# Patient Record
Sex: Male | Born: 1960 | Race: White | Hispanic: No | Smoking: Never smoker
Health system: Southern US, Community
[De-identification: ages and names within clinical notes are randomized; demographics above are authoritative.]

## PROBLEM LIST (undated history)

## (undated) DIAGNOSIS — N419 Inflammatory disease of prostate, unspecified: Secondary | ICD-10-CM

## (undated) DIAGNOSIS — E781 Pure hyperglyceridemia: Secondary | ICD-10-CM

## (undated) DIAGNOSIS — I10 Essential (primary) hypertension: Secondary | ICD-10-CM

## (undated) HISTORY — DX: Inflammatory disease of prostate, unspecified: N41.9

## (undated) HISTORY — DX: Essential (primary) hypertension: I10

## (undated) HISTORY — DX: Pure hyperglyceridemia: E78.1

## (undated) HISTORY — PX: SKIN CANCER EXCISION: SHX779

---

## 2005-10-22 ENCOUNTER — Emergency Department (HOSPITAL_COMMUNITY): Admission: EM | Admit: 2005-10-22 | Discharge: 2005-10-22 | Payer: Self-pay | Admitting: Emergency Medicine

## 2010-04-02 ENCOUNTER — Ambulatory Visit: Payer: Self-pay | Admitting: Cardiology

## 2010-06-25 ENCOUNTER — Ambulatory Visit (INDEPENDENT_AMBULATORY_CARE_PROVIDER_SITE_OTHER): Payer: BC Managed Care – PPO | Admitting: Cardiology

## 2010-06-25 DIAGNOSIS — R002 Palpitations: Secondary | ICD-10-CM

## 2010-06-25 DIAGNOSIS — I10 Essential (primary) hypertension: Secondary | ICD-10-CM

## 2010-06-25 DIAGNOSIS — R0789 Other chest pain: Secondary | ICD-10-CM

## 2010-07-09 ENCOUNTER — Encounter (INDEPENDENT_AMBULATORY_CARE_PROVIDER_SITE_OTHER): Payer: BC Managed Care – PPO | Admitting: Cardiology

## 2010-07-09 ENCOUNTER — Ambulatory Visit (HOSPITAL_COMMUNITY): Payer: BC Managed Care – PPO | Attending: Cardiology

## 2010-07-09 ENCOUNTER — Encounter: Payer: Self-pay | Admitting: Cardiology

## 2010-07-09 DIAGNOSIS — R9439 Abnormal result of other cardiovascular function study: Secondary | ICD-10-CM | POA: Insufficient documentation

## 2010-07-09 DIAGNOSIS — R079 Chest pain, unspecified: Secondary | ICD-10-CM | POA: Insufficient documentation

## 2010-07-09 DIAGNOSIS — R002 Palpitations: Secondary | ICD-10-CM | POA: Insufficient documentation

## 2010-07-09 DIAGNOSIS — R072 Precordial pain: Secondary | ICD-10-CM

## 2010-07-09 HISTORY — PX: US ECHOCARDIOGRAPHY: HXRAD669

## 2010-07-13 NOTE — Assessment & Plan Note (Signed)
Summary: np6/per patsy/mj   Visit Type:  eph Primary Provider:  None  CC:  feels like he has irregular heart beat at times. .  History of Present Illness: Ricky Bennett had a stress Echo today for atypical CP and palpitations. I have reviewed the history with him as well. His GXT showed excellent exercise tolerance but inferolateral ST changes that resolved at 2 mins in recovery. Echo pending. Discussed with pt. and left message for Dr Swaziland to followup.  Current Medications (verified): 1)  Azor 5-20 Mg Tabs (Amlodipine-Olmesartan) .... Take 1 Tablet By Mouth Once A Day  Allergies (verified): No Known Drug Allergies  Vital Signs:  Patient profile:   50 year old male Height:      72 inches Weight:      184 pounds BMI:     25.05 Pulse rate:   78 / minute Resp:     16 per minute BP sitting:   110 / 70  (left arm) Cuff size:   large  Vitals Entered By: Celestia Khat, CMA (July 09, 2010 12:52 PM)   Problems:  Medical Problems Added: 1)  Dx of Abnormal Cv (STRESS) Test  (ICD-794.39)  Impression & Recommendations:  Problem # 1:  ABNORMAL CV (STRESS) TEST (ICD-794.39) Discussed with patient. Dr Swaziland will followup with Echo and the patient early next week.

## 2010-07-14 ENCOUNTER — Telehealth: Payer: Self-pay | Admitting: *Deleted

## 2010-07-14 NOTE — Telephone Encounter (Signed)
Spoke w/ wife re: results of stress echo for [employer]

## 2010-08-13 ENCOUNTER — Telehealth: Payer: Self-pay | Admitting: Cardiology

## 2010-08-13 NOTE — Telephone Encounter (Signed)
Spoke w/ wife stating he wanted to add some vitamins to the other 17 vitamins. Advised that fish oil should be taken but other vitamins may not benefit him.

## 2010-08-13 NOTE — Telephone Encounter (Signed)
Had a question about vitamins that he is taking. Please call back. I have pulled the chart.

## 2010-09-08 ENCOUNTER — Encounter (INDEPENDENT_AMBULATORY_CARE_PROVIDER_SITE_OTHER): Payer: Self-pay | Admitting: General Surgery

## 2010-09-17 LAB — BASIC METABOLIC PANEL
BUN: 16 mg/dL (ref 6–23)
CO2: 24 mEq/L (ref 19–32)
Chloride: 103 mEq/L (ref 96–112)
GFR calc Af Amer: >60 mL/min (ref >60)
Potassium: 4.1 mEq/L (ref 3.5–5.1)
Sodium: 139 mEq/L (ref 135–145)

## 2010-09-17 LAB — DIFFERENTIAL
Basophils Absolute: 0 10*3/uL (ref 0.0–0.1)
Eosinophils Relative: 0 % (ref 0–5)
Lymphs Abs: 1.2 10*3/uL (ref 0.7–4.0)
Monocytes Absolute: 1.1 10*3/uL — ABNORMAL HIGH (ref 0.1–1.0)
Neutro Abs: 12.4 10*3/uL — ABNORMAL HIGH (ref 1.7–7.7)

## 2010-09-17 LAB — CBC
Hemoglobin: 14.8 g/dL (ref 13.0–17.0)
Platelets: 206 10*3/uL (ref 150–400)
RBC: 4.7 MIL/uL (ref 4.22–5.81)

## 2010-09-21 ENCOUNTER — Other Ambulatory Visit: Payer: Self-pay | Admitting: General Surgery

## 2010-09-21 ENCOUNTER — Ambulatory Visit (HOSPITAL_BASED_OUTPATIENT_CLINIC_OR_DEPARTMENT_OTHER)
Admission: RE | Admit: 2010-09-21 | Discharge: 2010-09-21 | Disposition: A | Discharge status: Home / Self Care | Payer: BC Managed Care – PPO | Source: Ambulatory Visit | Attending: General Surgery | Admitting: General Surgery

## 2010-09-21 DIAGNOSIS — K644 Residual hemorrhoidal skin tags: Secondary | ICD-10-CM | POA: Insufficient documentation

## 2010-09-21 DIAGNOSIS — Z01812 Encounter for preprocedural laboratory examination: Secondary | ICD-10-CM | POA: Insufficient documentation

## 2010-09-21 DIAGNOSIS — K648 Other hemorrhoids: Secondary | ICD-10-CM | POA: Insufficient documentation

## 2010-09-21 HISTORY — PX: HEMORRHOID SURGERY: SHX153

## 2010-10-28 NOTE — Op Note (Signed)
Ricky Bennett, Ricky Bennett                 ACCOUNT NO.:  [id number]  MEDICAL RECORD NO.:  [id number]           PATIENT TYPE:  LOCATION:                                 FACILITY:  PHYSICIAN:  Mary Sella. Andrey Campanile, MD     DATE OF BIRTH:  03/16/1961  DATE OF PROCEDURE:  09/21/2010 DATE OF DISCHARGE:                              OPERATIVE REPORT   PREOPERATIVE DIAGNOSIS:  Hemorrhoids.  POSTOPERATIVE DIAGNOSIS:  Grade 2, 3 column internal hemorrhoids.  PROCEDURE: 1. Exam under anesthesia. 2. Hemorrhoidal banding x2 (right posterior and left lateral internal     hemorrhoids). 3. Open excisional hemorrhoidectomy of right anterior internal-     external hemorrhoid.  SURGEON:  Mary Sella. Andrey Campanile, MD  ASSISTANT SURGEON:  None.  ANESTHESIA:  General plus 30 cc of 0.25% Marcaine with epinephrine.  ASSESSMENT:  Internal and external hemorrhoid.  ESTIMATED BLOOD LOSS:  Minimal.  FINDINGS:  The patient had right posterior and a left lateral grade 2 internal hemorrhoids which were banded.  He also had a right anterior internal and external hemorrhoid which was excised with aid of Harmonic scalpel.  The mucosal defect was left open.  INDICATIONS FOR PROCEDURE:  The patient is very pleasant 50 year old gentleman who reports a longstanding history with hemorrhoids over 20 years.  Actually, he has seen Dr. Ezzard Standing back in 1996 and had recommended some banding as well as excisional hemorrhoidectomy, but the patient cancelled the procedure.  He comes in today complaining of occasional flares of hemorrhoidal problems.  About once a month period, he will have 1 or 2 drops of blood in the commode.  His other bowel habits were within normal limits.  He has daily bowel movements.  He does not strain.  He is not a bathroom reader so I offered him an exam under anesthesia with hemorrhoidectomy, possible banding.  The patient elected to proceed to surgery.  We discussed the risks and benefits of surgery including  but not limited to bleeding, infection, pelvic sepsis, urinary retention, DVT occurrence, hemorrhoidal recurrence, injury to the sphincter, and anal canal narrowing.  We also talked about typical postoperative recovery course.  DESCRIPTION OF PROCEDURE:  After obtaining informed consent, the patient was brought to the operating room.  General endotracheal anesthesia was established.  Sequential compression devices had been placed.  He was then placed in the prone jackknife position with the appropriate padding.  His buttocks were taped apart.  His perineum was prepped and draped after Betadine had been prepped.  He received cefoxitin prior to skin incision.  Digital rectal exam was performed which demonstrated no gross palpable hard mass.  Anoscopy was then performed which demonstrated grade 2 right posterior, left lateral internal hemorrhoids as well as right anterior mixed column internal and external hemorrhoid. I first turned my attention to the right anterior internal and external hemorrhoid.  A small V-shaped incision was made in the anoderm right at the anal verge with a #15 blade.  Then using hemostat, I lifted up the hemorrhoidal plexus including the mucosa and submucosa away from the underlying sphincter.  Then using a Harmonic scalpel,  I made an elliptical excision of the hemorrhoid in its entirety.  This came out to the dentate line.  The hemorrhoidal complex was excised.  There was excellent hemostasis.  I then turned my attention to the left lateral position.  I grasped this on the left side was a grade 2 internal hemorrhoid.  I placed 2 rubber bands around the base of the hemorrhoid. In the similar fashion, I put 2 bands around the base of the right posterior internal hemorrhoid.  I then infiltrated 0.25% Marcaine underneath the mucosa around the anal verge and I did a perineal block, a total 30 mL was used.  A piece of Gelfoam was placed in the patient's rectal vault.  We  then placed dibucaine ointment around the perineum.  A 4 x 4s, ABDs, and mesh underwear were then applied.  He was then placed in the supine position, extubated, and taken to recovery room in stable addition.  All needle, instrument, and sponge counts were correct x2. There were no immediate complications.  The patient tolerated the procedure well.     Mary Sella. Andrey Campanile, MD     EMW/MEDQ  D:  09/21/2010  T:  09/22/2010  Job:  [id number]  cc:   Sarah Swaziland, MD  Electronically Signed by Gaynelle Adu M.D. on 10/28/2010 12:02:12 PM

## 2010-11-05 ENCOUNTER — Ambulatory Visit (INDEPENDENT_AMBULATORY_CARE_PROVIDER_SITE_OTHER): Payer: BC Managed Care – PPO | Admitting: General Surgery

## 2010-11-05 ENCOUNTER — Encounter (INDEPENDENT_AMBULATORY_CARE_PROVIDER_SITE_OTHER): Payer: Self-pay | Admitting: General Surgery

## 2010-11-05 DIAGNOSIS — K648 Other hemorrhoids: Secondary | ICD-10-CM

## 2010-11-05 NOTE — Patient Instructions (Signed)

## 2010-11-05 NOTE — Progress Notes (Signed)
Procedure: Exam under anesthesia, hemorrhoidal banding x2 (right posterior and left lateral); excisional hemorrhoidectomy right anterior internal/external hemorrhoid on Sep 21, 2010  History of Present Ilness: 50 year old Caucasian male comes in today for his second postoperative visit. He is much improved since his last visit. He denies any fevers, chills, nausea, or vomiting. He denies any diarrhea or constipation. He reports a daily bowel movement. He denies any pain after defecation. He denies any perianal itching or burning. He denies any melena or hematochezia. His only issue is some jock itch currently. He started using Lamisil yesterday  Physical Exam: There were no vitals taken for this visit.  Well-developed well-nourished Caucasian male in no apparent distress Abdomen-soft nontender, nondistended Rectal-his right anterior incision is completely healed. There is no cellulitis or induration. There is no excoriation of the skin. In the right anterior and left posterior position there is some non-thrombosed external hemorrhoidal tissue. It is no longer enlarged like it was at the last visit. Digital rectal exam reveals good tone. Anoscopy shows no residual internal hemorrhoids  Data reviewed I reviewed the operative note as well as my office visit note from October 01, 2010  Pathology: n/a  Assessment and Plan: Status post exam under anesthesia, hemorrhoidal banding, and excisional hemorrhoidectomy of right anterior internal/external hemorrhoid. I think he's doing very well. He was given educational material about good bowel habits. I encouraged him to continue to Lamisil for his jock itch. We will see him on a p.r.n. basis.

## 2010-11-16 ENCOUNTER — Telehealth: Payer: Self-pay | Admitting: Cardiology

## 2010-11-16 NOTE — Telephone Encounter (Signed)
Wife called stating she is very concern about her husband "fits of rage". He will go off on a rage and yells and that causes BP to go up. She doesn't know what to do. Advised I will speak w/ Dr. Swaziland but there is probably not much we can do. States he doesn't have a PCP. She thinks he needs a "nerve" pill. Will call her back after talking w/Dr. Swaziland.

## 2010-11-16 NOTE — Telephone Encounter (Signed)
Lm

## 2010-11-16 NOTE — Telephone Encounter (Signed)
Called because her husband is having some emotional symptoms (which causes him to go into great rages) that she would like to discuss with you before his office visit this Friday. Please call back. I have pulled the chart.

## 2010-11-17 ENCOUNTER — Telehealth: Payer: Self-pay | Admitting: *Deleted

## 2010-11-17 ENCOUNTER — Encounter: Payer: Self-pay | Admitting: Cardiology

## 2010-11-17 NOTE — Telephone Encounter (Signed)
Follow up from conversation yesterday. Spoke w/Dr. Swaziland and he is not sure how he can approach his behavior without him bringing it up. Suggested they see a counselor. She states they see a marriage counselor but she hasn't addressed those issues. Suggested she speak w/her separately and see if she can suggest to him that he see a specialist. She will try to speak w/ her today.

## 2010-11-19 ENCOUNTER — Ambulatory Visit (INDEPENDENT_AMBULATORY_CARE_PROVIDER_SITE_OTHER): Payer: BC Managed Care – PPO | Admitting: Cardiology

## 2010-11-19 ENCOUNTER — Encounter: Payer: Self-pay | Admitting: Cardiology

## 2010-11-19 DIAGNOSIS — E781 Pure hyperglyceridemia: Secondary | ICD-10-CM | POA: Insufficient documentation

## 2010-11-19 DIAGNOSIS — I10 Essential (primary) hypertension: Secondary | ICD-10-CM

## 2010-11-19 NOTE — Assessment & Plan Note (Signed)
Blood pressure is well controlled on Azor. We will continue with his current therapy and continue to encourage sodium restriction regular aerobic exercise.

## 2010-11-19 NOTE — Patient Instructions (Signed)
Continue current medications.  Stay active.  I will see you again in 6 months with fasting lab work.

## 2010-11-19 NOTE — Progress Notes (Signed)
Ricky Bennett Date of Birth: 06-27-1960   History of Present Illness: Ricky Bennett is seen for followup of his hypertension. He has checked his blood pressure and frequently but reports it has been doing well. He states he feels great. He still exercises regularly either rollerblading or walking. He denies any significant palpitations, dizziness, chest pain, or shortness of breath.  Current Outpatient Prescriptions on File Prior to Visit  Medication Sig Dispense Refill  . Acai 500 MG CAPS Take by mouth.        Marland Kitchen amLODipine-olmesartan (AZOR) 5-20 MG per tablet Take 1 tablet by mouth daily.        . Cholecalciferol 2000 UNITS TABS Take by mouth.        . Cinnamon 500 MG TABS Take by mouth.        Marland Kitchen Cod Liver Oil 1250-130 UNITS CAPS Take by mouth.        . Coenzyme Q10 (CO Q-10) 200 MG CAPS Take by mouth.        . Cranberry 250 MG CAPS Take by mouth.        . Cyanocobalamin (B-12) 500 MCG TABS Take by mouth.        . Flaxseed, Linseed, (FLAX SEED OIL PO) Take 2,400 mg by mouth.        . folic acid (FOLVITE) 800 MCG tablet Take 400 mcg by mouth daily.        . Garlic 2000 MG CAPS Take by mouth.        . Glucosamine-Chondroitin-Vit C 2000-1200-60 MG/30ML LIQD Take by mouth.        . Grape Seed 50 MG TABS Take by mouth.        Ricky Bennett 550 MG CAPS Take by mouth.        . Magnesium 250 MG TABS Take by mouth.        . Methylsulfonylmethane 1000 MG CAPS Take by mouth.        . Niacin-Inositol 400-100 MG CAPS Take by mouth.        . Omega-3 Fatty Acids (FISH OIL) 1200 MG CAPS Take by mouth 2 (two) times daily after a meal.        . Phytosterol Esters (PHYTOSTEROL COMPLEX) 500 MG CAPS Take by mouth.        . pyridOXINE (VITAMIN B-6) 100 MG tablet Take 100 mg by mouth daily.        . Saw Palmetto, Serenoa repens, 450 MG CAPS Take by mouth 2 (two) times daily.        . vitamin C (ASCORBIC ACID) 500 MG tablet Take 500 mg by mouth daily.          No Known Allergies  Past Medical  History  Diagnosis Date  . High blood pressure   . Prostatitis   . Hemorrhoids   . Hypertriglyceridemia     Past Surgical History  Procedure Date  . Hemorrhoid surgery 09/21/10    hemorrhoidal banding x2; Rt anterior hemorrhoidectomy  . US echocardiography 07/09/2010    History  Smoking status  . Never Smoker   Smokeless tobacco  . Not on file    History  Alcohol Use  . Yes    Family History  Problem Relation Age of Onset  . Heart attack Father   . Hypertension Father   . Stroke Father   . Hypertension Brother   . Stroke Brother   . Coronary artery disease Brother     Review of  Systems: As noted in history of present illness. He did undergo a banding hemorrhoidectomy in May. All other systems were reviewed and are negative.  Physical Exam: BP 116/80  Pulse 70  Ht 6' (1.829 m)  Wt 188 lb 3.2 oz (85.367 kg)  BMI 25.52 kg/m2 He is a middle-aged white male in no acute distress. His HEENT exam is unremarkable. He has no JVD or bruits. There is no adenopathy. Lungs are clear. Cardiac exam reveals a regular rate and rhythm without gallop, murmur, or click. Abdomen is soft and nontender without masses or bruits. Femoral and pedal pulses are 2+ and symmetric. He is alert and oriented x3. Cranial nerves II through XII are intact. LABORATORY DATA:   Assessment / Plan:

## 2010-11-19 NOTE — Assessment & Plan Note (Signed)
He is on multiple supplements including multiple omega-3 supplements and niacin. Will repeat fasting blood work on his next visit in 6 months.

## 2010-12-23 ENCOUNTER — Telehealth (INDEPENDENT_AMBULATORY_CARE_PROVIDER_SITE_OTHER): Payer: Self-pay | Admitting: General Surgery

## 2010-12-23 NOTE — Telephone Encounter (Signed)
Patient left message on my voicemail yesterday inquiring about an appt with Dr Andrey Campanile. He needed an appt prior to the end of this month which is in 2 days due to insurance changing. I tried to call him back but the number he left was busy and I left a message on his cell phone.

## 2011-01-24 ENCOUNTER — Other Ambulatory Visit: Payer: Self-pay | Admitting: *Deleted

## 2011-01-24 MED ORDER — AMLODIPINE-OLMESARTAN 5-20 MG PO TABS: 1.0000 | ORAL_TABLET | Freq: Every day | ORAL | Status: DC

## 2011-01-25 ENCOUNTER — Other Ambulatory Visit: Payer: Self-pay | Admitting: Cardiology

## 2011-01-25 NOTE — Telephone Encounter (Signed)
azor 5-20 mg refill needed

## 2011-01-26 MED ORDER — AMLODIPINE-OLMESARTAN 5-20 MG PO TABS: 1.0000 | ORAL_TABLET | Freq: Every day | ORAL | Status: DC

## 2011-01-26 NOTE — Telephone Encounter (Signed)
Pt has completely ran out of RX. Please call in today.

## 2011-01-26 NOTE — Telephone Encounter (Signed)
escribe medication per fax request  

## 2011-01-26 NOTE — Telephone Encounter (Signed)
Pt called back and wants Azor called into [facility] on [address]  It was called in, but to the wrong phar.  Pt is a phar now waiting for this to be called in.  Pt is upset.

## 2011-01-27 ENCOUNTER — Other Ambulatory Visit: Payer: Self-pay | Admitting: *Deleted

## 2011-01-27 MED ORDER — AMLODIPINE-OLMESARTAN 5-20 MG PO TABS: 1.0000 | ORAL_TABLET | Freq: Every day | ORAL | Status: DC

## 2011-01-27 NOTE — Telephone Encounter (Signed)
Refilled azor

## 2011-06-01 ENCOUNTER — Other Ambulatory Visit: Payer: BC Managed Care – PPO | Admitting: *Deleted

## 2011-06-01 ENCOUNTER — Ambulatory Visit (INDEPENDENT_AMBULATORY_CARE_PROVIDER_SITE_OTHER): Payer: BC Managed Care – PPO | Admitting: Cardiology

## 2011-06-01 ENCOUNTER — Encounter: Payer: Self-pay | Admitting: Cardiology

## 2011-06-01 VITALS — BP 120/86 | HR 64 | Ht 72.0 in | Wt 198.0 lb

## 2011-06-01 DIAGNOSIS — I10 Essential (primary) hypertension: Secondary | ICD-10-CM

## 2011-06-01 DIAGNOSIS — E781 Pure hyperglyceridemia: Secondary | ICD-10-CM

## 2011-06-01 DIAGNOSIS — E785 Hyperlipidemia, unspecified: Secondary | ICD-10-CM

## 2011-06-01 LAB — LIPID PANEL
Cholesterol: 197 mg/dL (ref 0–200)
HDL: 41.5 mg/dL (ref >39.00)
Total CHOL/HDL Ratio: 5
Triglycerides: 257 mg/dL — ABNORMAL HIGH (ref 0.0–149.0)
VLDL: 51.4 mg/dL — ABNORMAL HIGH (ref 0.0–40.0)

## 2011-06-01 LAB — BASIC METABOLIC PANEL
BUN: 15 mg/dL (ref 6–23)
Calcium: 9.5 mg/dL (ref 8.4–10.5)
Chloride: 104 mEq/L (ref 96–112)
Creatinine, Ser: 0.8 mg/dL (ref 0.4–1.5)
GFR: 114 mL/min (ref >60.00)
Potassium: 4.2 mEq/L (ref 3.5–5.1)

## 2011-06-01 NOTE — Assessment & Plan Note (Signed)
Blood pressure is well-controlled on exam today. We will continue with his current antihypertensive therapy.

## 2011-06-01 NOTE — Assessment & Plan Note (Signed)
He is on multiple supplements including fish oil and niacin. We will follow up on the fasting lipid panel today. Continue to focus on weight control and regular aerobic exercise.

## 2011-06-01 NOTE — Patient Instructions (Addendum)
We will check blood work today and call with the results.  I will see you again in 6 months.

## 2011-06-01 NOTE — Progress Notes (Signed)
Ricky Bennett Date of Birth: 02/23/1961   History of Present Illness: Ricky Bennett is seen for followup of his hypertension. He has checked his blood pressure and frequently but reports it has been doing well. He states he feels great. He isn't exercising as much because of an injury to his leg. He has gained some weight. He denies any significant palpitations, dizziness, chest pain, or shortness of breath.  Current Outpatient Prescriptions on File Prior to Visit  Medication Sig Dispense Refill  . Acai 500 MG CAPS Take by mouth.        Marland Kitchen amLODipine-olmesartan (AZOR) 5-20 MG per tablet Take 1 tablet by mouth daily.  30 tablet  5  . Cholecalciferol 2000 UNITS TABS Take by mouth.        . Cinnamon 500 MG TABS Take by mouth.        Marland Kitchen Cod Liver Oil 1250-130 UNITS CAPS Take by mouth.        . Coenzyme Q10 (CO Q-10) 200 MG CAPS Take by mouth.        . Cranberry 250 MG CAPS Take by mouth.        . Cyanocobalamin (B-12) 500 MCG TABS Take by mouth.        . Flaxseed, Linseed, (FLAX SEED OIL PO) Take 2,400 mg by mouth.        . folic acid (FOLVITE) 800 MCG tablet Take 400 mcg by mouth daily.        . Garlic 2000 MG CAPS Take by mouth.        . Glucosamine-Chondroitin-Vit C 2000-1200-60 MG/30ML LIQD Take by mouth.        . Grape Seed 50 MG TABS Take by mouth.        Asencion Gowda 550 MG CAPS Take by mouth.        . Magnesium 250 MG TABS Take by mouth.        . Methylsulfonylmethane 1000 MG CAPS Take by mouth.        . Niacin-Inositol 400-100 MG CAPS Take by mouth.        . Omega-3 Fatty Acids (FISH OIL) 1200 MG CAPS Take by mouth 2 (two) times daily after a meal.        . Phytosterol Esters (PHYTOSTEROL COMPLEX) 500 MG CAPS Take by mouth.        . pyridOXINE (VITAMIN B-6) 100 MG tablet Take 100 mg by mouth daily.        . Saw Palmetto, Serenoa repens, 450 MG CAPS Take by mouth 2 (two) times daily.        . vitamin C (ASCORBIC ACID) 500 MG tablet Take 500 mg by mouth daily.          No  Known Allergies  Past Medical History  Diagnosis Date  . HTN (hypertension)   . Prostatitis   . Hemorrhoids   . Hypertriglyceridemia     Past Surgical History  Procedure Date  . Hemorrhoid surgery 09/21/10    hemorrhoidal banding x2; Rt anterior hemorrhoidectomy  . US echocardiography 07/09/2010    History  Smoking status  . Never Smoker   Smokeless tobacco  . Not on file    History  Alcohol Use  . Yes    Family History  Problem Relation Age of Onset  . Heart attack Father   . Hypertension Father   . Stroke Father   . Hypertension Brother   . Stroke Brother   . Coronary artery disease  Brother     Review of Systems: As noted in history of present illness.  All other systems were reviewed and are negative.  Physical Exam: BP 120/86  Pulse 64  Ht 6' (1.829 m)  Wt 198 lb (89.812 kg)  BMI 26.85 kg/m2 He is a middle-aged white male in no acute distress. His HEENT exam is unremarkable. He has no JVD or bruits. There is no adenopathy. Lungs are clear. Cardiac exam reveals a regular rate and rhythm without gallop, murmur, or click. Abdomen is soft and nontender without masses or bruits. Femoral and pedal pulses are 2+ and symmetric. He is alert and oriented x3. Cranial nerves II through XII are intact. LABORATORY DATA:   Assessment / Plan:

## 2011-06-02 ENCOUNTER — Encounter: Payer: Self-pay | Admitting: Internal Medicine

## 2011-06-02 LAB — HEPATIC FUNCTION PANEL
Alkaline Phosphatase: 51 U/L (ref 39–117)
Total Bilirubin: 0.8 mg/dL (ref 0.3–1.2)

## 2011-06-09 ENCOUNTER — Encounter: Payer: Self-pay | Admitting: Gastroenterology

## 2011-06-09 ENCOUNTER — Encounter: Payer: Self-pay | Admitting: Internal Medicine

## 2011-06-13 ENCOUNTER — Encounter: Payer: Self-pay | Admitting: Internal Medicine

## 2011-06-13 ENCOUNTER — Other Ambulatory Visit (INDEPENDENT_AMBULATORY_CARE_PROVIDER_SITE_OTHER): Payer: BC Managed Care – PPO

## 2011-06-13 ENCOUNTER — Ambulatory Visit (INDEPENDENT_AMBULATORY_CARE_PROVIDER_SITE_OTHER): Payer: Self-pay | Admitting: Internal Medicine

## 2011-06-13 VITALS — BP 110/84 | HR 82 | Ht 72.0 in | Wt 192.0 lb

## 2011-06-13 DIAGNOSIS — Z1211 Encounter for screening for malignant neoplasm of colon: Secondary | ICD-10-CM

## 2011-06-13 DIAGNOSIS — R7989 Other specified abnormal findings of blood chemistry: Secondary | ICD-10-CM

## 2011-06-13 LAB — IBC PANEL
Iron: 100 ug/dL (ref 42–165)
Saturation Ratios: 24.5 % (ref 20.0–50.0)

## 2011-06-13 LAB — HEPATIC FUNCTION PANEL
AST: 33 U/L (ref 0–37)
Total Bilirubin: 0.8 mg/dL (ref 0.3–1.2)

## 2011-06-13 LAB — PROTIME-INR: Prothrombin Time: 10.8 s (ref 10.2–12.4)

## 2011-06-13 LAB — FERRITIN: Ferritin: 107.6 ng/mL (ref 22.0–322.0)

## 2011-06-13 NOTE — Patient Instructions (Signed)
Dr. Dorena Bodo recommends you get a colonoscopy, please call to schedule at your earliest convenience.   You have been scheduled for an Ultasound at [facility] on [address]  On 06/14/2011 @ 9:30am; please arrive 15 min prior to your appointment.  Nothing to eat or drink after midnight that day of your appointment.  If you need to reschedule please call [phone]  Your physician has requested that you go to the basement for lab work before leaving today:

## 2011-06-13 NOTE — Progress Notes (Signed)
Subjective:    Patient ID: Ricky Bennett, male    DOB: 02/16/1961, 51 y.o.   MRN: [id number]  HPI Mr. Seefeldt is a 51 year old male with a past medical history of hypertension who seen in consultation at the request of Dr. Swaziland for evaluation of elevated transaminases.  The patient reports no prior history of trouble or abnormal liver enzymes, but he does admit these have probably not been checked often.  He denies a history of jaundice, itching, GI bleeding, ascites, LE edema, confusion. He denies abdominal pain, nausea, vomiting. Weight is stable with normal appetite. Normal bowel habits. He reports no significant change in medications, but does take many over-the-counter vitamin and dietary supplements. These medications are all balled locally, and not over the Internet or internationally. He denies a family history of liver disease. He denies alcohol use regularly, and reports very rarely having half a glass of wine.  Review of Systems Constitutional: Negative for fever, chills, night sweats, activity change, appetite change and unexpected weight change HEENT: Negative for sore throat, mouth sores and trouble swallowing. Eyes: Negative for visual disturbance Respiratory: Negative for cough, chest tightness and shortness of breath Cardiovascular: Negative for chest pain, palpitations and lower extremity swelling Gastrointestinal: See history of present illness Genitourinary: Negative for dysuria and hematuria. Musculoskeletal: Negative for back pain, arthralgias and myalgias Skin: Negative for rash or color change Neurological: Negative for headaches, weakness, numbness Hematological: Negative for adenopathy, negative for easy bruising/bleeding Psychiatric/behavioral: Negative for depressed mood, negative for anxiety  Patient Active Problem List  Diagnoses  . HTN (hypertension)  . Hypertriglyceridemia   Past Surgical History  Procedure Date  . Hemorrhoid surgery 09/21/10   hemorrhoidal banding x2; Rt anterior hemorrhoidectomy  . US echocardiography 07/09/2010   Current Outpatient Prescriptions  Medication Sig Dispense Refill  . Acai 500 MG CAPS Take 1,000 mg by mouth.       Marland Kitchen amLODipine-olmesartan (AZOR) 5-20 MG per tablet Take 1 tablet by mouth daily.  30 tablet  5  . Arginine 500 MG CAPS Take by mouth. 400 mg      . Ascorbic Acid (VITAMIN C) 100 MG tablet Take 100 mg by mouth daily.      Marland Kitchen BIOTIN PO Take by mouth.      . cholecalciferol (VITAMIN D) 1000 UNITS tablet Take 1,000 Units by mouth daily.      . Cinnamon 500 MG TABS Take by mouth.        Marland Kitchen Cod Liver Oil 1250-130 UNITS CAPS Take by mouth.        . Coenzyme Q10 (CO Q-10) 200 MG CAPS Take by mouth.        . Cranberry 250 MG CAPS Take by mouth.        . Cyanocobalamin (B-12) 500 MCG TABS Take 1,000 mcg by mouth.       . Flaxseed, Linseed, (FLAX SEED OIL PO) Take 2,400 mg by mouth.        . folic acid (FOLVITE) 800 MCG tablet Take 400 mcg by mouth daily.       . Garlic 2000 MG CAPS Take 75 mg by mouth.       . Glucosamine-Chondroitin-Vit C 2000-1200-60 MG/30ML LIQD Take by mouth.        . Grape Seed 50 MG TABS Take by mouth.        Asencion Gowda 550 MG CAPS Take by mouth.        . Magnesium 250 MG TABS Take 500 mg by  mouth.       . Niacin-Inositol 400-100 MG CAPS Take by mouth.        . Omega-3 Fatty Acids (FISH OIL) 1000 MG CPDR Take by mouth.      . Phytosterol Esters (PHYTOSTEROL COMPLEX) 500 MG CAPS Take by mouth.        . pyridOXINE (VITAMIN B-6) 100 MG tablet Take 100 mg by mouth daily.        . Saw Palmetto, Serenoa repens, 450 MG CAPS Take 900 mg by mouth 2 (two) times daily.       . vitamin C (ASCORBIC ACID) 500 MG tablet Take 500 mg by mouth daily.        Marland Kitchen VITAMIN E BLEND PO Take by mouth. 3 IU with Cranberry supplement       No Known Allergies  Family History  Problem Relation Age of Onset  . Heart attack Father   . Hypertension Father   . Stroke Father   . Hypertension  Brother   . Stroke Brother   . Coronary artery disease Brother   Neg for CRC   Social History  . Marital Status: Married    Number of Children: 6   Occupational History  . burglar alarms/fire alarms    Social History Main Topics  . Smoking status: Never Smoker   . Smokeless tobacco: None  . Alcohol Use: No  . Drug Use: No      Objective:   Physical Exam BP 110/84  Pulse 82  Ht 6' (1.829 m)  Wt 192 lb (87.091 kg)  BMI 26.04 kg/m2 Constitutional: Well-developed and well-nourished. No distress. HEENT: Normocephalic and atraumatic. Oropharynx is clear and moist. No oropharyngeal exudate. Conjunctivae are normal. Pupils are equal round and reactive to light. No scleral icterus. Neck: Neck supple. Trachea midline. Cardiovascular: Normal rate, regular rhythm and intact distal pulses. No M/R/G Pulmonary/chest: Effort normal and breath sounds normal. No wheezing, rales or rhonchi. Abdominal: Soft, nontender, nondistended. Bowel sounds active throughout. There are no masses palpable. No hepatosplenomegaly. Extremities: no clubbing, cyanosis, or edema Lymphadenopathy: No cervical adenopathy noted. Neurological: Alert and oriented to person place and time. Skin: Skin is warm and dry. No rashes noted. Skin is considerably tanned even on abdomen Psychiatric: Normal mood and affect. Behavior is normal.  CMP     Component Value Date/Time   NA 139 06/01/2011 1245   K 4.2 06/01/2011 1245   CL 104 06/01/2011 1245   CO2 28 06/01/2011 1245   GLUCOSE 104* 06/01/2011 1245   BUN 15 06/01/2011 1245   CREATININE 0.8 06/01/2011 1245   CALCIUM 9.5 06/01/2011 1245   PROT 7.4 06/01/2011 1245   ALBUMIN 4.5 06/01/2011 1245   AST 57* 06/01/2011 1245   ALT 100* 06/01/2011 1245   ALKPHOS 51 06/01/2011 1245   BILITOT 0.8 06/01/2011 1245   GFRNONAA >60 09/17/2010 1411   GFRAA  Value: >60        The eGFR has been calculated using the MDRD equation. This calculation has not been validated in all clinical situations. eGFR's  persistently <60 mL/min signify possible Chronic Kidney Disease. 09/17/2010 1411    CBC    Component Value Date/Time   WBC 14.7* 09/17/2010 1411   RBC 4.70 09/17/2010 1411   HGB 14.8 09/17/2010 1411   HCT 43.6 09/17/2010 1411   PLT 206 09/17/2010 1411   MCV 92.8 09/17/2010 1411   MCH 31.5 09/17/2010 1411   MCHC 33.9 09/17/2010 1411   RDW 12.6 09/17/2010 1411  LYMPHSABS 1.2 09/17/2010 1411   MONOABS 1.1* 09/17/2010 1411   EOSABS 0.0 09/17/2010 1411   BASOSABS 0.0 09/17/2010 1411   ABDOMEN CT WITHOUT CONTRAST - 10/22/05:    Technique: Multidetector CT imaging of the abdomen was performed following the standard protocol without IV contrast.    Comparison: None.    Findings: The liver and spleen have normal uninfused features. The stomach, duodenum, pancreas, gallbladder, and adrenal glands are unremarkable.   A punctate nonobstructing stone is noted in the upper pole of the right kidney. The left kidney is edematous with perinephric stranding and stranding around the proximal ureter. No intraperitoneal free fluid. No evidence for abdominal lymphadenopathy.    IMPRESSION:    1. Secondary changes in the left kidney. There is mild fullness of the collecting system but no overt hydronephrosis.   2. Punctate nonobstructing upper pole calculus on the right.   PELVIS CT WITHOUT CONTRAST - 10/22/05:    Technique: Multidetector CT imaging of the pelvis was performed following the standard protocol without IV contrast.    Findings: A 2 mm stone is identified in the distal left ureter, about 1 cm proximal to the ureterovesical junction. There is no evidence for bladder stones. No right ureteral calculi are apparent. There is no intraperitoneal free fluid.   Mild diverticular change is noted in the left colon without diverticulitis. The appendix and terminal ileum are normal.   IMPRESSION:    1. 2 mm calculus in the distal left ureter with mild secondary changes in the left kidney.   2. Diverticulosis.         Assessment & Plan:  51 year old male with a past medical history of hypertension who seen in consultation at the request of Dr. Sw[place]iland for evaluation of elevated transaminases  1. Elevated liver enzymes -- the patient did have mildly elevated AST/ALT on recent hepatic function panel. On further review there is no prior values available for trending.  At this point, the first thing is to repeat the liver function tests to see if these abnormalities remain. The pattern of injury is a hepatocellular, and I will order additional lab work to workup and exclude other possible causes for liver inflammation such as genetic diseases.  It does not appear that alcohol is a contributing issue in this patient. He does take multiple vitamins and herbal supplements, which possibly could explain his mild hepatitis.  For now I will check viral hepatitis studies, iron studies, ANA and IgG, ASMA, AMA, ceruloplasmin, alpha-1.  Also order a liver ultrasound with Doppler to evaluate liver blood flow.  I'll see him back after the studies are completed to further discuss possible workup. If the inflammation process over time and no exact etiology is determined, then we discussed possible liver biopsy.  2. CRC screening -- the patient is average risk for colorectal cancer, and I have recommended colonoscopy for him at this time based on his age.  He would like to think more about this, and is aware of my recommendation. We can discuss this again at followup

## 2011-06-14 ENCOUNTER — Ambulatory Visit
Admission: RE | Admit: 2011-06-14 | Discharge: 2011-06-14 | Disposition: A | Discharge status: Home / Self Care | Payer: BC Managed Care – PPO | Source: Ambulatory Visit | Attending: Internal Medicine | Admitting: Internal Medicine

## 2011-06-14 LAB — ANA: Anti Nuclear Antibody(ANA): NEGATIVE

## 2011-06-14 LAB — HEPATITIS A ANTIBODY, TOTAL: Hep A Total Ab: NEGATIVE

## 2011-06-15 LAB — ANTI-SMOOTH MUSCLE ANTIBODY, IGG: Smooth Muscle Ab: 4 U (ref <20)

## 2011-06-15 LAB — MITOCHONDRIAL ANTIBODIES: Mitochondrial M2 Ab, IgG: 0.13 (ref <0.91)

## 2011-06-20 ENCOUNTER — Encounter: Payer: Self-pay | Admitting: *Deleted

## 2011-06-20 ENCOUNTER — Other Ambulatory Visit: Payer: Self-pay | Admitting: Internal Medicine

## 2011-06-20 NOTE — Telephone Encounter (Signed)
Opened in error

## 2011-06-28 ENCOUNTER — Telehealth: Payer: Self-pay | Admitting: Internal Medicine

## 2011-06-28 ENCOUNTER — Encounter: Payer: Self-pay | Admitting: Internal Medicine

## 2011-06-28 ENCOUNTER — Ambulatory Visit (INDEPENDENT_AMBULATORY_CARE_PROVIDER_SITE_OTHER): Payer: BC Managed Care – PPO | Admitting: Internal Medicine

## 2011-06-28 VITALS — BP 118/80 | HR 78 | Ht 72.0 in | Wt 192.0 lb

## 2011-06-28 DIAGNOSIS — K5730 Diverticulosis of large intestine without perforation or abscess without bleeding: Secondary | ICD-10-CM | POA: Insufficient documentation

## 2011-06-28 DIAGNOSIS — K5792 Diverticulitis of intestine, part unspecified, without perforation or abscess without bleeding: Secondary | ICD-10-CM

## 2011-06-28 DIAGNOSIS — K5732 Diverticulitis of large intestine without perforation or abscess without bleeding: Secondary | ICD-10-CM

## 2011-06-28 MED ORDER — METRONIDAZOLE 500 MG PO TABS: 500.0000 mg | ORAL_TABLET | Freq: Three times a day (TID) | ORAL | Status: AC

## 2011-06-28 MED ORDER — CIPROFLOXACIN HCL 500 MG PO TABS: 500.0000 mg | ORAL_TABLET | Freq: Two times a day (BID) | ORAL | Status: AC

## 2011-06-28 NOTE — Progress Notes (Signed)
  Subjective:    Patient ID: Ricky Bennett, male    DOB: [birth year], 51 y.o.   MRN: [id number]  HPI Mr. Bilal is a 51 year old male whom I saw about 2 weeks ago for evaluation of elevation in liver transaminases, who returns today in an acute visit for evaluation of left lower quadrant pain. The patient states that over the past 4 days he's had increasing left lower quadrant pain. He describes this as a near constant pain with periods of more sharp intense pain. It did cause him difficulty with sleeping last night. He reports it is tender to the touch and worse when he needs to pass gas. He has not had fevers or chills. He reports normal bowel movements without diarrhea or constipation. He denies bright red blood per rectum or melena. His appetite has been okay and he has not had nausea or vomiting. He reports having twinges of similar pain in the past but this is usually self-limiting and did not last longer than one day.  Review of Systems As per history of present illness otherwise negative   Current Medications, Allergies, Past Medical History, Past Surgical History, Family History and Social History were reviewed in [employer] record.      Objective:   Physical Exam BP 118/80  Pulse 78  Ht 6' (1.829 m)  Wt 192 lb (87.091 kg)  BMI 26.04 kg/m2  SpO2 99% Constitutional: Well-developed and well-nourished. No distress. HEENT: Normocephalic and atraumatic. Oropharynx is clear and moist. No oropharyngeal exudate. Conjunctivae are normal. Pupils are equal round and reactive to light. No scleral icterus. Cardiovascular: Normal rate, regular rhythm and intact distal pulses. No M/R/G Pulmonary/chest: Effort normal and breath sounds normal. No wheezing, rales or rhonchi. Abdominal: Soft, moderate tenderness to palpation the left lower quadrant without rebound or guarding, nondistended. Bowel sounds active throughout. There are no masses palpable. No  hepatosplenomegaly. Extremities: no clubbing, cyanosis, or edema Neurological: Alert and oriented to person place and time. Skin: Skin is warm and dry. No rashes noted. Psychiatric: Normal mood and affect. Behavior is normal.     Assessment & Plan:  51 year old male whom I saw about 2 weeks ago for evaluation of elevation in liver transaminases, who returns today in an acute visit for evaluation of left lower quadrant pain.  1. LLQ pain -- the patient's never had a colonoscopy, but on review of prior imaging he did have a CT scan of his abdomen and pelvis which showed left-sided diverticulosis (this was done in 2007). The most likely etiology of the patient's current symptoms is mild diverticulitis. At this point I have recommended empiric therapy for diverticulitis with ciprofloxacin and metronidazole. He'll take these for 10 days. I have again recommended colonoscopy, but this should be done after his diverticulitis has resolved completely. We will place a reminder for a colonoscopy in 2-3 months. I've asked that he contact us if his symptoms worsen or if he develops fever, chills, nausea or vomiting. He voices understanding. I also recommended that he complete the antibiotic course even if he feels better quickly. Again, he voices understanding.

## 2011-06-28 NOTE — Telephone Encounter (Signed)
Pt reports l side abd pain or soreness to touch x 4 days. Pt denies a fever, diarrhea or blood in his stool. He has not had a COLON here before. Per Dr Rhea Belton, add pt on today at 10am.

## 2011-06-28 NOTE — Patient Instructions (Signed)
We have sent the following medications to your pharmacy for you to pick up at your convenience: Cipro and Flagyl, please take as directed.  Dr. Rhea Belton would like to see you back in 12 weeks for a colonoscopy.

## 2011-06-29 ENCOUNTER — Telehealth: Payer: Self-pay | Admitting: Internal Medicine

## 2011-06-29 NOTE — Telephone Encounter (Signed)
Pt has been reading the side effects and warnings on Cipro and Flagyl handouts. He had dark urine and was encouraged to drink more fluids. He will stop his magnesium supplement until he completes therapy for diverticulitis. Pt stated understanding and will call back for further problems or questions.

## 2011-07-28 ENCOUNTER — Other Ambulatory Visit: Payer: Self-pay

## 2011-07-28 ENCOUNTER — Other Ambulatory Visit: Payer: Self-pay | Admitting: Cardiology

## 2011-07-28 MED ORDER — AMLODIPINE-OLMESARTAN 5-20 MG PO TABS: 1.0000 | ORAL_TABLET | Freq: Every day | ORAL | Status: DC

## 2011-09-20 ENCOUNTER — Telehealth: Payer: Self-pay | Admitting: *Deleted

## 2011-09-20 NOTE — Telephone Encounter (Signed)
Message copied by Florene Glen on Tue Sep 20, 2011 10:50 AM ------      Message from: Florene Glen      Created: Mon Jun 20, 2011  4:35 PM       Pt needs labs on 1 day- INR, Hepatic Panel, then appt with Dr Rhea Belton the next

## 2011-09-20 NOTE — Telephone Encounter (Signed)
lmom for pt to call back

## 2011-09-23 NOTE — Telephone Encounter (Signed)
Informed pt that he needs a f/u with Dr Rhea Belton and labs at least a day before the visit. Pt stated understanding and will come on 10/03/11.

## 2011-09-27 ENCOUNTER — Encounter: Payer: Self-pay | Admitting: Internal Medicine

## 2011-10-03 ENCOUNTER — Ambulatory Visit: Payer: BC Managed Care – PPO | Admitting: Internal Medicine

## 2011-10-17 ENCOUNTER — Ambulatory Visit: Payer: BC Managed Care – PPO | Admitting: Internal Medicine

## 2011-10-20 ENCOUNTER — Other Ambulatory Visit (INDEPENDENT_AMBULATORY_CARE_PROVIDER_SITE_OTHER): Payer: BC Managed Care – PPO

## 2011-10-20 ENCOUNTER — Encounter: Payer: Self-pay | Admitting: Internal Medicine

## 2011-10-20 DIAGNOSIS — R7989 Other specified abnormal findings of blood chemistry: Secondary | ICD-10-CM

## 2011-10-20 LAB — HEPATIC FUNCTION PANEL
ALT: 37 U/L (ref 0–53)
AST: 29 U/L (ref 0–37)
Total Bilirubin: 0.9 mg/dL (ref 0.3–1.2)

## 2011-10-21 ENCOUNTER — Encounter: Payer: Self-pay | Admitting: Internal Medicine

## 2011-10-21 ENCOUNTER — Ambulatory Visit (INDEPENDENT_AMBULATORY_CARE_PROVIDER_SITE_OTHER): Payer: BC Managed Care – PPO | Admitting: Internal Medicine

## 2011-10-21 VITALS — BP 110/74 | HR 66 | Ht 72.0 in | Wt 189.6 lb

## 2011-10-21 DIAGNOSIS — Z1211 Encounter for screening for malignant neoplasm of colon: Secondary | ICD-10-CM

## 2011-10-21 DIAGNOSIS — R7989 Other specified abnormal findings of blood chemistry: Secondary | ICD-10-CM

## 2011-10-21 DIAGNOSIS — Z8719 Personal history of other diseases of the digestive system: Secondary | ICD-10-CM

## 2011-10-21 MED ORDER — PEG-KCL-NACL-NASULF-NA ASC-C 100 G PO SOLR
1.0000 | Freq: Once | ORAL | Status: DC
Start: 2011-10-21 — End: 2012-12-19

## 2011-10-21 NOTE — Progress Notes (Signed)
Subjective:    Patient ID: Ricky Bennett, male    DOB: [birth year], 51 y.o.   MRN: [id number]  HPI 51 year old male with a past medical history of hypertension who seen in follow-up for elevated serum transaminases and treated diverticulitis. He reports that he is doing quite well. His left lower quadrant abdominal pain improved promptly with antibiotics and has not returned. His bowel habits have been normal for him without blood or melena. No further abdominal pain. Regarding his liver, he denies complaints. No jaundice. No itching. No GI bleeding. No ascites or other extremity edema. He continues to use multiple over-the-counter supplements all of which are listed in his med list. He is not drinking significant amounts of alcohol present. No fevers or chills. He feels well.  Review of Systems As per history of present illness, otherwise negative  Current Medications, Allergies, Past Medical History, Past Surgical History, Family History and Social History were reviewed in [employer] record.     Objective:   Physical Exam BP 110/74  Pulse 66  Ht 6' (1.829 m)  Wt 189 lb 9.6 oz (86.002 kg)  BMI 25.71 kg/m2 Constitutional: Well-developed and well-nourished. No distress. HEENT: Normocephalic and atraumatic. Oropharynx is clear and moist. No oropharyngeal exudate. Conjunctivae are normal. Pupils are equal round and reactive to light. No scleral icterus. Neck: Neck supple. Trachea midline. Cardiovascular: Normal rate, regular rhythm and intact distal pulses. No M/R/G Pulmonary/chest: Effort normal and breath sounds normal. No wheezing, rales or rhonchi. Abdominal: Soft, nontender, nondistended. Bowel sounds active throughout. There are no masses palpable. No hepatosplenomegaly. Extremities: no clubbing, cyanosis, or edema Lymphadenopathy: No cervical adenopathy noted. Neurological: Alert and oriented to person place and time. Skin: Skin is warm and dry. No rashes  noted. Psychiatric: Normal mood and affect. Behavior is normal.  CMP     Component Value Date/Time   NA 139 06/01/2011 1245   K 4.2 06/01/2011 1245   CL 104 06/01/2011 1245   CO2 28 06/01/2011 1245   GLUCOSE 104* 06/01/2011 1245   BUN 15 06/01/2011 1245   CREATININE 0.8 06/01/2011 1245   CALCIUM 9.5 06/01/2011 1245   PROT 7.0 10/20/2011 1408   ALBUMIN 4.1 10/20/2011 1408   AST 29 10/20/2011 1408   ALT 37 10/20/2011 1408   ALKPHOS 46 10/20/2011 1408   BILITOT 0.9 10/20/2011 1408   GFRNONAA >60 09/17/2010 1411   GFRAA  Value: >60        The eGFR has been calculated using the MDRD equation. This calculation has not been validated in all clinical situations. eGFR's persistently <60 mL/min signify possible Chronic Kidney Disease. 09/17/2010 1411   Right upper quadrant ultrasound Clinical Data:  History of kidney stones.  Elevated liver function test.   LIMITED ABDOMINAL ULTRASOUND - RIGHT UPPER QUADRANT   Comparison:  CT 10/22/2005   Findings:   Gallbladder:  Normal gallbladder.  No gallstones or gallbladder wall thickening.   Common bile duct:  3.6 cm.   Liver:  Echogenic liver diffusely compatible with fatty infiltration.  No focal liver lesion.   IMPRESSION: Negative for gallstones.   Fatty liver.      Assessment & Plan:  51 year old male with a past medical history of hypertension who seen in follow-up for elevated serum transaminases and treated diverticulitis  1. Elevated LFTs/fatty liver -- the patient's hepatic panel was checked yesterday and his serum transaminases have normalized. In fact his entire liver profile is normal now. He had an intensive laboratory evaluation for other  underlying causes of liver disease and this was negative. His ultrasound showed fatty liver and therefore it is likely that fatty liver contributor to his mild transaminitis. I reminded him that diet and exercise, and modest weight loss can improve his liver numbers. We will continue to watch is going forward,  but now there does not appear to be significant liver inflammation/hepatitis. We will recheck his liver profile in 6 months. I've asked that he continue to avoid alcohol whenever possible.  2. History of diverticulitis/cortical cancer screening -- I diagnosed him clinically with diverticulitis in February and treated him successfully with antibiotics. Review CT scan did show the presence of diverticulosis but he has never had a colonoscopy. I discussed with him scheduling colonoscopy now both for colorectal cancer screening and for direct visualization given his episode of diverticulitis. He is agreeable to proceed.

## 2011-10-21 NOTE — Patient Instructions (Addendum)
You have been scheduled for a colonoscopy with propofol. Please follow written instructions given to you at your visit today.  Please pick up your prep kit at the pharmacy within the next 1-3 days.  Your physician has requested that you go to the basement for the following lab work before leaving today: Hepatic Function panel, in December  We have sent the following medications to your pharmacy for you to pick up at your convenience: moviprep, you were given instructions today at your visit

## 2011-11-19 ENCOUNTER — Other Ambulatory Visit: Payer: Self-pay | Admitting: Cardiology

## 2011-12-15 ENCOUNTER — Telehealth: Payer: Self-pay | Admitting: Internal Medicine

## 2011-12-15 ENCOUNTER — Encounter: Payer: BC Managed Care – PPO | Admitting: Internal Medicine

## 2011-12-20 ENCOUNTER — Ambulatory Visit: Payer: BC Managed Care – PPO | Admitting: Cardiology

## 2012-01-13 ENCOUNTER — Ambulatory Visit: Payer: BC Managed Care – PPO | Admitting: Cardiology

## 2012-01-19 ENCOUNTER — Encounter: Payer: Self-pay | Admitting: Internal Medicine

## 2012-01-30 ENCOUNTER — Ambulatory Visit: Payer: BC Managed Care – PPO | Admitting: Cardiology

## 2012-02-09 ENCOUNTER — Ambulatory Visit: Payer: BC Managed Care – PPO | Admitting: Cardiology

## 2012-02-17 ENCOUNTER — Ambulatory Visit: Payer: BC Managed Care – PPO | Admitting: Cardiology

## 2012-03-15 ENCOUNTER — Encounter: Payer: Self-pay | Admitting: Cardiology

## 2012-03-15 ENCOUNTER — Ambulatory Visit (INDEPENDENT_AMBULATORY_CARE_PROVIDER_SITE_OTHER): Payer: BC Managed Care – PPO | Admitting: Cardiology

## 2012-03-15 VITALS — BP 120/84 | HR 70 | Ht 72.0 in | Wt 190.1 lb

## 2012-03-15 DIAGNOSIS — E781 Pure hyperglyceridemia: Secondary | ICD-10-CM

## 2012-03-15 DIAGNOSIS — I10 Essential (primary) hypertension: Secondary | ICD-10-CM

## 2012-03-15 NOTE — Progress Notes (Signed)
Ricky Bennett Date of Birth: May 07, 1960   History of Present Illness: Ricky Bennett is seen for followup of his hypertension. He has checked his blood pressure and frequently but reports it has been doing well. He continues to exercise regularly with rollerskating. Previously he was noted to have elevated LFTs. He underwent extensive evaluation with GI. Blood work was unremarkable. Abdominal ultrasound showed normal gallbladder with some fatty infiltration of the liver. Subsequent liver function studies returned to normal.   Current Outpatient Prescriptions on File Prior to Visit  Medication Sig Dispense Refill  . Acai 500 MG CAPS Take 1,000 mg by mouth.       . Arginine 500 MG CAPS Take by mouth. 400 mg      . Ascorbic Acid (VITAMIN C) 100 MG tablet Take 100 mg by mouth daily.      . AZOR 5-20 MG per tablet TAKE 1 TABLET BY MOUTH DAILY.  30 tablet  3  . BIOTIN PO Take by mouth.      . cholecalciferol (VITAMIN D) 1000 UNITS tablet Take 1,000 Units by mouth daily.      . Cinnamon 500 MG TABS Take by mouth.        Marland Kitchen Cod Liver Oil 1250-130 UNITS CAPS Take by mouth.        . Coenzyme Q10 (CO Q-10) 200 MG CAPS Take by mouth.        . Cranberry 250 MG CAPS Take by mouth.        . Cyanocobalamin (B-12) 500 MCG TABS Take 1,000 mcg by mouth.       . Flaxseed, Linseed, (FLAX SEED OIL PO) Take 2,400 mg by mouth.        . folic acid (FOLVITE) 800 MCG tablet Take 400 mcg by mouth daily.       . Garlic 2000 MG CAPS Take 75 mg by mouth.       . Glucosamine-Chondroitin-Vit C 2000-1200-60 MG/30ML LIQD Take by mouth.        . Grape Seed 50 MG TABS Take by mouth.        Asencion Gowda 550 MG CAPS Take by mouth.        . Magnesium 250 MG TABS Take 500 mg by mouth.       . Niacin-Inositol 400-100 MG CAPS Take by mouth.        . Omega-3 Fatty Acids (FISH OIL) 1000 MG CPDR Take by mouth.      . peg 3350 powder (MOVIPREP) 100 G SOLR Take 1 kit (100 g total) by mouth once.  1 kit  0  . Phytosterol Esters  (PHYTOSTEROL COMPLEX) 500 MG CAPS Take by mouth.        . pyridOXINE (VITAMIN B-6) 100 MG tablet Take 100 mg by mouth daily.        . Saw Palmetto, Serenoa repens, 450 MG CAPS Take 900 mg by mouth 2 (two) times daily.       . vitamin C (ASCORBIC ACID) 500 MG tablet Take 500 mg by mouth daily.        Marland Kitchen VITAMIN E BLEND PO Take by mouth. 3 IU with Cranberry supplement        No Known Allergies  Past Medical History  Diagnosis Date  . HTN (hypertension)   . Prostatitis   . Hemorrhoids   . Hypertriglyceridemia     Past Surgical History  Procedure Date  . Hemorrhoid surgery 09/21/10    hemorrhoidal banding x2; Rt  anterior hemorrhoidectomy  . US echocardiography 07/09/2010    History  Smoking status  . Never Smoker   Smokeless tobacco  . Never Used    History  Alcohol Use No    Family History  Problem Relation Age of Onset  . Heart attack Father   . Hypertension Father   . Stroke Father   . Hypertension Brother   . Stroke Brother   . Coronary artery disease Brother     Review of Systems: As noted in history of present illness.  He continues to take multiple supplements. All other systems were reviewed and are negative.  Physical Exam: BP 120/84  Pulse 70  Ht 6' (1.829 m)  Wt 190 lb 2.1 oz (86.242 kg)  BMI 25.79 kg/m2  SpO2 95% He is a middle-aged white male in no acute distress. His HEENT exam is unremarkable. He has no JVD or bruits. There is no adenopathy. Lungs are clear. Cardiac exam reveals a regular rate and rhythm without gallop, murmur, or click. Abdomen is soft and nontender without masses or bruits. Femoral and pedal pulses are 2+ and symmetric. He is alert and oriented x3. Cranial nerves II through XII are intact. LABORATORY DATA: ECG demonstrates normal sinus rhythm with nonspecific T-wave abnormality.  Assessment / Plan: 1. Hypertension. Blood pressures well controlled on Azor. We will continue his current therapy.  2. Hypertriglyceridemia. I  recommended continued focus on a Mediterranean diet and restriction of simple sweets. Can't maintain good weight control and regular exercise. We will followup fasting lab work on his next visit in 6 months.

## 2012-03-15 NOTE — Patient Instructions (Signed)
Continue your current BP therapy  Work on eating a healthy diet (Mediterranean), regular exercise, and control your weight.  I will see you in 6 months.

## 2012-04-24 ENCOUNTER — Other Ambulatory Visit: Payer: Self-pay

## 2012-04-24 MED ORDER — AMLODIPINE-OLMESARTAN 5-20 MG PO TABS: 1.0000 | ORAL_TABLET | Freq: Every day | ORAL | Status: DC

## 2012-07-27 ENCOUNTER — Encounter: Payer: Self-pay | Admitting: Internal Medicine

## 2012-08-22 ENCOUNTER — Other Ambulatory Visit: Payer: Self-pay | Admitting: Cardiology

## 2012-09-07 ENCOUNTER — Other Ambulatory Visit (INDEPENDENT_AMBULATORY_CARE_PROVIDER_SITE_OTHER): Payer: BC Managed Care – PPO

## 2012-09-07 DIAGNOSIS — E781 Pure hyperglyceridemia: Secondary | ICD-10-CM

## 2012-09-07 DIAGNOSIS — I10 Essential (primary) hypertension: Secondary | ICD-10-CM

## 2012-09-07 LAB — LIPID PANEL
Cholesterol: 169 mg/dL (ref 0–200)
HDL: 38.7 mg/dL — ABNORMAL LOW (ref >39.00)
LDL Cholesterol: 94 mg/dL (ref 0–99)
Triglycerides: 180 mg/dL — ABNORMAL HIGH (ref 0.0–149.0)
VLDL: 36 mg/dL (ref 0.0–40.0)

## 2012-09-07 LAB — BASIC METABOLIC PANEL
BUN: 13 mg/dL (ref 6–23)
Chloride: 104 mEq/L (ref 96–112)
Potassium: 4 mEq/L (ref 3.5–5.1)
Sodium: 138 mEq/L (ref 135–145)

## 2012-09-07 LAB — HEPATIC FUNCTION PANEL
ALT: 51 U/L (ref 0–53)
Albumin: 4.3 g/dL (ref 3.5–5.2)
Bilirubin, Direct: 0 mg/dL (ref 0.0–0.3)
Total Protein: 7.1 g/dL (ref 6.0–8.3)

## 2012-09-20 ENCOUNTER — Other Ambulatory Visit: Payer: Self-pay | Admitting: Cardiology

## 2012-11-20 ENCOUNTER — Other Ambulatory Visit: Payer: Self-pay | Admitting: Cardiology

## 2012-11-22 ENCOUNTER — Ambulatory Visit: Payer: BC Managed Care – PPO | Admitting: Cardiology

## 2012-12-10 ENCOUNTER — Encounter: Payer: Self-pay | Admitting: Cardiology

## 2012-12-19 ENCOUNTER — Encounter: Payer: Self-pay | Admitting: Cardiology

## 2012-12-19 ENCOUNTER — Ambulatory Visit (INDEPENDENT_AMBULATORY_CARE_PROVIDER_SITE_OTHER): Payer: BC Managed Care – PPO | Admitting: Cardiology

## 2012-12-19 VITALS — BP 127/78 | HR 86 | Ht 72.0 in | Wt 182.8 lb

## 2012-12-19 DIAGNOSIS — I10 Essential (primary) hypertension: Secondary | ICD-10-CM

## 2012-12-19 DIAGNOSIS — E781 Pure hyperglyceridemia: Secondary | ICD-10-CM

## 2012-12-19 NOTE — Patient Instructions (Signed)
Continue your current therapy  I will see you in 6 months with fasting lab work.   

## 2012-12-19 NOTE — Progress Notes (Signed)
Ricky Bennett Date of Birth: 1960-08-11   History of Present Illness: Ricky Bennett is seen for followup of his hypertension.  He continues to exercise regularly with rollerskating. He is under increased stress recently due to family factors. He continue to take a lot of health food supplements. No chest pain or SOB. Rare lightheadedness.  Current Outpatient Prescriptions on File Prior to Visit  Medication Sig Dispense Refill  . Acai 500 MG CAPS Take 1,000 mg by mouth.       Marland Kitchen ALFALFA PO Take by mouth as directed.      . Arginine 500 MG CAPS Take by mouth. 400 mg      . Ascorbic Acid (VITAMIN C) 100 MG tablet Take 100 mg by mouth daily.      . AZOR 5-20 MG per tablet TAKE 1 TABLET BY MOUTH DAILY  30 tablet  2  . BIOTIN PO Take by mouth.      . cholecalciferol (VITAMIN D) 1000 UNITS tablet Take 1,000 Units by mouth daily.      . Cinnamon 500 MG TABS Take by mouth.        Marland Kitchen Cod Liver Oil 1250-130 UNITS CAPS Take by mouth.        . Coenzyme Q10 (CO Q-10) 200 MG CAPS Take by mouth.        . Cranberry 250 MG CAPS Take by mouth.        . Cyanocobalamin (B-12) 500 MCG TABS Take 1,000 mcg by mouth.       . Flaxseed, Linseed, (FLAX SEED OIL PO) Take 2,400 mg by mouth.        . folic acid (FOLVITE) 800 MCG tablet Take 400 mcg by mouth daily.       . Garlic 2000 MG CAPS Take 75 mg by mouth.       . Glucosamine-Chondroitin-Vit C 2000-1200-60 MG/30ML LIQD Take by mouth.        . Grape Seed 50 MG TABS Take by mouth.        Asencion Gowda 550 MG CAPS Take by mouth.        . Magnesium 250 MG TABS Take 500 mg by mouth.       . Niacin-Inositol 400-100 MG CAPS Take by mouth.        . Omega-3 Fatty Acids (FISH OIL) 1000 MG CPDR Take by mouth.      . Phytosterol Esters (PHYTOSTEROL COMPLEX) 500 MG CAPS Take by mouth.        . pyridOXINE (VITAMIN B-6) 100 MG tablet Take 100 mg by mouth daily.        . Saw Palmetto, Serenoa repens, 450 MG CAPS Take 900 mg by mouth 2 (two) times daily.       . vitamin C  (ASCORBIC ACID) 500 MG tablet Take 500 mg by mouth daily.        Marland Kitchen VITAMIN E BLEND PO Take by mouth. 3 IU with Cranberry supplement       No current facility-administered medications on file prior to visit.    No Known Allergies  Past Medical History  Diagnosis Date  . HTN (hypertension)   . Prostatitis   . Hemorrhoids   . Hypertriglyceridemia     Past Surgical History  Procedure Laterality Date  . Hemorrhoid surgery  09/21/10    hemorrhoidal banding x2; Rt anterior hemorrhoidectomy  . US echocardiography  07/09/2010    History  Smoking status  . Never Smoker   Smokeless  tobacco  . Never Used    History  Alcohol Use No    Family History  Problem Relation Age of Onset  . Heart attack Father   . Hypertension Father   . Stroke Father   . Hypertension Brother   . Stroke Brother   . Coronary artery disease Brother     Review of Systems: As noted in history of present illness.  He continues to take multiple supplements. All other systems were reviewed and are negative.  Physical Exam: BP 127/78  Pulse 86  Ht 6' (1.829 m)  Wt 182 lb 12.8 oz (82.918 kg)  BMI 24.79 kg/m2 He is a middle-aged white male in no acute distress. His HEENT exam is unremarkable. He has no JVD or bruits. There is no adenopathy. Lungs are clear. Cardiac exam reveals a regular rate and rhythm without gallop, murmur, or click. Abdomen is soft and nontender without masses or bruits. Femoral and pedal pulses are 2+ and symmetric. He is alert and oriented x3. Cranial nerves II through XII are intact. LABORATORY DATA: Lab Results  Component Value Date   WBC 14.7* 09/17/2010   HGB 14.8 09/17/2010   HCT 43.6 09/17/2010   PLT 206 09/17/2010   GLUCOSE 98 09/07/2012   CHOL 169 09/07/2012   TRIG 180.0* 09/07/2012   HDL 38.70* 09/07/2012   LDLDIRECT 121.8 06/01/2011   LDLCALC 94 09/07/2012   ALT 51 09/07/2012   AST 32 09/07/2012   NA 138 09/07/2012   K 4.0 09/07/2012   CL 104 09/07/2012   CREATININE 0.8  09/07/2012   BUN 13 09/07/2012   CO2 25 09/07/2012   INR 1.1* 10/20/2011     Assessment / Plan: 1. Hypertension. Blood pressures well controlled on Azor. We will continue his current therapy.  2. Hypertriglyceridemia. I recommended continued focus on a Mediterranean diet and restriction of simple sweets. Last triglyceride level has come down nicely. We will followup fasting lab work on his next visit in 6 months.

## 2013-01-09 ENCOUNTER — Encounter: Payer: Self-pay | Admitting: Internal Medicine

## 2013-02-13 ENCOUNTER — Encounter: Payer: BC Managed Care – PPO | Admitting: Internal Medicine

## 2013-02-16 ENCOUNTER — Other Ambulatory Visit: Payer: Self-pay | Admitting: Cardiology

## 2013-07-19 ENCOUNTER — Telehealth: Payer: Self-pay | Admitting: Cardiology

## 2013-07-19 DIAGNOSIS — E781 Pure hyperglyceridemia: Secondary | ICD-10-CM

## 2013-07-19 DIAGNOSIS — I10 Essential (primary) hypertension: Secondary | ICD-10-CM

## 2013-07-19 NOTE — Telephone Encounter (Signed)
Returned call to patient no answer.LMTC. 

## 2013-07-19 NOTE — Telephone Encounter (Signed)
New problem   Pt want to know do the doctor recommend the life line screening that is advertised on TV. Pt want to talk to nurse concerning this.

## 2013-07-22 ENCOUNTER — Other Ambulatory Visit: Payer: BC Managed Care – PPO

## 2013-07-22 NOTE — Telephone Encounter (Signed)
Patient is retuning your call,  Please call cell #.

## 2013-07-22 NOTE — Telephone Encounter (Signed)
Returned call to patient no answer.LMTC. 

## 2013-07-22 NOTE — Telephone Encounter (Signed)
Returned call to patient he stated he wanted to know what Dr.Jordan thought about having Life Line Screening that is advertised on TV.Stated it is a mobile screening unit that screens all arteries and also screens colon.When reviewing patient's chart he is over due for appointment with Dr.Jordan.Appointment scheduled with Dr.Jordan 07/25/13,also will have fasting lab same day.Patient will discuss Life Line Screening at appointment.

## 2013-07-25 ENCOUNTER — Ambulatory Visit (INDEPENDENT_AMBULATORY_CARE_PROVIDER_SITE_OTHER): Payer: BC Managed Care – PPO | Admitting: Cardiology

## 2013-07-25 ENCOUNTER — Encounter: Payer: Self-pay | Admitting: Cardiology

## 2013-07-25 ENCOUNTER — Ambulatory Visit: Payer: BC Managed Care – PPO | Admitting: Cardiology

## 2013-07-25 VITALS — BP 120/80 | HR 63 | Ht 72.0 in | Wt 185.4 lb

## 2013-07-25 DIAGNOSIS — E781 Pure hyperglyceridemia: Secondary | ICD-10-CM

## 2013-07-25 DIAGNOSIS — I10 Essential (primary) hypertension: Secondary | ICD-10-CM

## 2013-07-25 LAB — LIPID PANEL
CHOL/HDL RATIO: 4
Cholesterol: 178 mg/dL (ref 0–200)
HDL: 41.4 mg/dL (ref >39.00)
LDL Cholesterol: 112 mg/dL — ABNORMAL HIGH (ref 0–99)
TRIGLYCERIDES: 122 mg/dL (ref 0.0–149.0)
VLDL: 24.4 mg/dL (ref 0.0–40.0)

## 2013-07-25 LAB — HEPATIC FUNCTION PANEL
ALBUMIN: 4.4 g/dL (ref 3.5–5.2)
ALT: 31 U/L (ref 0–53)
AST: 24 U/L (ref 0–37)
Alkaline Phosphatase: 46 U/L (ref 39–117)
BILIRUBIN TOTAL: 1.4 mg/dL — AB (ref 0.3–1.2)
Bilirubin, Direct: 0.1 mg/dL (ref 0.0–0.3)
Total Protein: 7.2 g/dL (ref 6.0–8.3)

## 2013-07-25 LAB — BASIC METABOLIC PANEL
BUN: 13 mg/dL (ref 6–23)
CO2: 26 meq/L (ref 19–32)
Calcium: 9.3 mg/dL (ref 8.4–10.5)
Chloride: 102 mEq/L (ref 96–112)
Creatinine, Ser: 0.8 mg/dL (ref 0.4–1.5)
GFR: 114.83 mL/min (ref >60.00)
GLUCOSE: 113 mg/dL — AB (ref 70–99)
POTASSIUM: 4.2 meq/L (ref 3.5–5.1)
SODIUM: 136 meq/L (ref 135–145)

## 2013-07-25 NOTE — Patient Instructions (Signed)
Continue your current therapy  We will check blood work today  I will see you in one year 

## 2013-07-25 NOTE — Progress Notes (Signed)
Ricky Bennett Date of Birth: Aug 21, 1960   History of Present Illness: Ricky Bennett is seen for followup of his hypertension.  He continues to exercise regularly with rollerskating. He continue to take a lot of health food supplements. No chest pain or SOB. Is considering [facility] screening.  Current Outpatient Prescriptions on File Prior to Visit  Medication Sig Dispense Refill  . Acai 500 MG CAPS Take 1,000 mg by mouth.       Marland Kitchen ALFALFA PO Take by mouth as directed.      . Arginine 500 MG CAPS Take by mouth. 400 mg      . Ascorbic Acid (VITAMIN C) 100 MG tablet Take 100 mg by mouth daily.      . AZOR 5-20 MG per tablet TAKE 1 TABLET BY MOUTH DAILY  30 tablet  3  . BIOTIN PO Take by mouth.      . cholecalciferol (VITAMIN D) 1000 UNITS tablet Take 1,000 Units by mouth daily.      . Cinnamon 500 MG TABS Take by mouth.        Marland Kitchen Cod Liver Oil 1250-130 UNITS CAPS Take by mouth.        . Coenzyme Q10 (CO Q-10) 200 MG CAPS Take by mouth.        . Cranberry 250 MG CAPS Take by mouth.        . Cyanocobalamin (B-12) 500 MCG TABS Take 1,000 mcg by mouth.       . Flaxseed, Linseed, (FLAX SEED OIL PO) Take 2,400 mg by mouth.        . folic acid (FOLVITE) 035 MCG tablet Take 400 mcg by mouth daily.       . Garlic 0093 MG CAPS Take 75 mg by mouth.       . Glucosamine-Chondroitin-Vit C 2000-1200-60 MG/30ML LIQD Take by mouth.        . Grape Seed 50 MG TABS Take by mouth.        Ricky Bennett 550 MG CAPS Take by mouth.        . Magnesium 250 MG TABS Take 500 mg by mouth.       . Niacin-Inositol 400-100 MG CAPS Take by mouth.        . Omega-3 Fatty Acids (FISH OIL) 1000 MG CPDR Take by mouth.      . peg 3350 powder (MOVIPREP) 100 G SOLR Take 1 kit by mouth as needed.      . Phytosterol Esters (PHYTOSTEROL COMPLEX) 500 MG CAPS Take by mouth.        . pyridOXINE (VITAMIN B-6) 100 MG tablet Take 100 mg by mouth daily.        . Saw Palmetto, Serenoa repens, 450 MG CAPS Take 900 mg by mouth 2 (two)  times daily.       . vitamin C (ASCORBIC ACID) 500 MG tablet Take 500 mg by mouth daily.        Marland Kitchen VITAMIN E BLEND PO Take by mouth. 3 IU with Cranberry supplement       No current facility-administered medications on file prior to visit.    No Known Allergies  Past Medical History  Diagnosis Date  . HTN (hypertension)   . Prostatitis   . Hemorrhoids   . Hypertriglyceridemia     Past Surgical History  Procedure Laterality Date  . Hemorrhoid surgery  09/21/10    hemorrhoidal banding x2; Rt anterior hemorrhoidectomy  . US echocardiography  07/09/2010  History  Smoking status  . Never Smoker   Smokeless tobacco  . Never Used    History  Alcohol Use No    Family History  Problem Relation Age of Onset  . Heart attack Father   . Hypertension Father   . Stroke Father   . Hypertension Brother   . Stroke Brother   . Coronary artery disease Brother     Review of Systems: As noted in history of present illness.  He continues to take multiple supplements. All other systems were reviewed and are negative.  Physical Exam: BP 120/80  Pulse 63  Ht 6' (1.829 m)  Wt 185 lb 6.4 oz (84.097 kg)  BMI 25.14 kg/m2 He is a middle-aged white male in no acute distress. His HEENT exam is unremarkable. He has no JVD or bruits. There is no adenopathy. Lungs are clear. Cardiac exam reveals a regular rate and rhythm without gallop, murmur, or click. Abdomen is soft and nontender without masses or bruits. Femoral and pedal pulses are 2+ and symmetric. He is alert and oriented x3. Cranial nerves II through XII are intact. LABORATORY DATA: Lab Results  Component Value Date   WBC 14.7* 09/17/2010   HGB 14.8 09/17/2010   HCT 43.6 09/17/2010   PLT 206 09/17/2010   GLUCOSE 98 09/07/2012   CHOL 169 09/07/2012   TRIG 180.0* 09/07/2012   HDL 38.70* 09/07/2012   LDLDIRECT 121.8 06/01/2011   LDLCALC 94 09/07/2012   ALT 51 09/07/2012   AST 32 09/07/2012   NA 138 09/07/2012   K 4.0 09/07/2012   CL 104  09/07/2012   CREATININE 0.8 09/07/2012   BUN 13 09/07/2012   CO2 25 09/07/2012   INR 1.1* 10/20/2011   Ecg: NSR, normal  Assessment / Plan: 1. Hypertension. Blood pressures well controlled on Azor. We will continue his current therapy.  2. Hypertriglyceridemia. Will check fasting lab work today.  I see no reason for him to have [facility] screening. Normal stress Echo in 2012. No carotid bruits, excellent pulses on exam. No evidence of aneurysm on exam. I think this would be a waste of his money.

## 2013-07-25 NOTE — Addendum Note (Signed)
Addended by: Tonita PhoenixBOWDEN, Shia Delaine K on: 07/25/2013 12:26 PM   Modules accepted: Orders

## 2013-09-12 ENCOUNTER — Other Ambulatory Visit: Payer: Self-pay | Admitting: Cardiology

## 2013-11-13 ENCOUNTER — Telehealth: Payer: Self-pay | Admitting: Cardiology

## 2013-11-13 NOTE — Telephone Encounter (Signed)
New problem   Pt don't know if he took his Azor and need to know if he should take it or wait til tomorrow.

## 2013-11-13 NOTE — Telephone Encounter (Signed)
Returned call to patient he stated he does not remember taking azor last night was wanting to know if he should take one this morning.Stated he takes Azor 5/20 mg every night.Advised not to take this morning take tonight as normal.

## 2014-03-15 ENCOUNTER — Other Ambulatory Visit: Payer: Self-pay | Admitting: Cardiology

## 2014-04-07 ENCOUNTER — Encounter: Payer: Self-pay | Admitting: Cardiology

## 2014-05-28 ENCOUNTER — Telehealth: Payer: Self-pay | Admitting: Cardiology

## 2014-05-29 ENCOUNTER — Ambulatory Visit: Payer: Self-pay | Admitting: Cardiology

## 2014-06-03 ENCOUNTER — Encounter: Payer: Self-pay | Admitting: Cardiology

## 2014-06-04 NOTE — Telephone Encounter (Signed)
Pt wants you to call him about his palptations and an appt.

## 2014-06-04 NOTE — Telephone Encounter (Signed)
Returned call to patient he stated for the past 2 weeks he has noticed frequent palpitations.No fast heart beat.No chest pain.Stated he had a dizzy spell last week that lasted appox 1 hour.Stated he does drink tea and pepsi occasionally.Advised to avoid caffeine and decongestants.Stated he was concerned and would like to be seen.Appointment scheduled with Nada BoozerLaura Ingold NP 06/10/14 at 11:30 am.

## 2014-06-04 NOTE — Telephone Encounter (Signed)
Pt is calling in stating that he would like to come in and see Dr. SwazilandJordan because for a while now he has been experiencing his heart skipping a beat and he is concern and would like to discuss this with Elnita Maxwellheryl . Please f/u  Thanks

## 2014-06-10 ENCOUNTER — Other Ambulatory Visit: Payer: Self-pay | Admitting: *Deleted

## 2014-06-10 ENCOUNTER — Encounter: Payer: Self-pay | Admitting: Cardiology

## 2014-06-10 ENCOUNTER — Ambulatory Visit (INDEPENDENT_AMBULATORY_CARE_PROVIDER_SITE_OTHER): Payer: BLUE CROSS/BLUE SHIELD | Admitting: Cardiology

## 2014-06-10 VITALS — BP 116/78 | HR 66 | Ht 72.0 in | Wt 193.1 lb

## 2014-06-10 DIAGNOSIS — R002 Palpitations: Secondary | ICD-10-CM

## 2014-06-10 NOTE — Progress Notes (Signed)
Cardiology Office Note   Date:  06/10/2014   ID:  Ricky Bennett, DOB Jan 14, 1961, MRN [id number]  PCP:  No primary care provider on file.  Cardiologist:  Dr. P. Martinique     Chief Complaint  Patient presents with  . Palpitations    patient reports occas episodes when his heart "skips beats." he also reports 1 episode of dizziness and unsteadiness.      History of Present Illness: Ricky Bennett is a 54 y.o. male who presents for skipped beats.  These skipped beats that make his chest feel like air blown into his chest began 3 weeks ago.  Intermittent.  No chest pain, no SOB.  Only other complaint is 1 episode of dizziness-more room spinning.  Lasted 1 hour and was not associated with the skipped beats.  His hypertension has been controlled though when he checks the BP at grocery stores it runs up some but never over 140/88. He continues to exercise regularly with rollerskating. He continue to take a lot of health food supplements, though none appear to cause skipped beats.  He rarely uses caffeine and when he did was not same day of skipped beats.  No cold medicines recently.    Past Medical History  Diagnosis Date  . HTN (hypertension)   . Prostatitis   . Hemorrhoids   . Hypertriglyceridemia     Past Surgical History  Procedure Laterality Date  . Hemorrhoid surgery  09/21/10    hemorrhoidal banding x2; Rt anterior hemorrhoidectomy  . US echocardiography  07/09/2010     Current Outpatient Prescriptions  Medication Sig Dispense Refill  . Acai 500 MG CAPS Take 1,000 mg by mouth.     Marland Kitchen ALFALFA PO Take by mouth as directed.    . Arginine 500 MG CAPS Take by mouth. 400 mg    . Ascorbic Acid (VITAMIN C) 100 MG tablet Take 100 mg by mouth daily.    . AZOR 5-20 MG per tablet TAKE 1 TABLET BY MOUTH DAILY 30 tablet 4  . BIOTIN PO Take by mouth.    . cholecalciferol (VITAMIN D) 1000 UNITS tablet Take 1,000 Units by mouth daily.    . Cinnamon 500 MG TABS Take by mouth.      Marland Kitchen Cod  Liver Oil 1250-130 UNITS CAPS Take by mouth.      . Coenzyme Q10 (CO Q-10) 200 MG CAPS Take by mouth.      . Cranberry 250 MG CAPS Take by mouth.      . Cyanocobalamin (B-12) 500 MCG TABS Take 1,000 mcg by mouth.     . Flaxseed, Linseed, (FLAX SEED OIL PO) Take 2,400 mg by mouth.      . folic acid (FOLVITE) 660 MCG tablet Take 400 mcg by mouth daily.     . Garlic 6301 MG CAPS Take 75 mg by mouth.     . Glucosamine-Chondroitin-Vit C 2000-1200-60 MG/30ML LIQD Take by mouth.      . Grape Seed 50 MG TABS Take by mouth.      Cathrine Muster 550 MG CAPS Take by mouth.      . Magnesium 250 MG TABS Take 500 mg by mouth.     . Niacin-Inositol 400-100 MG CAPS Take by mouth.      . Omega-3 Fatty Acids (FISH OIL) 1000 MG CPDR Take by mouth.    . peg 3350 powder (MOVIPREP) 100 G SOLR Take 1 kit by mouth as needed.    . Phytosterol Esters (PHYTOSTEROL  COMPLEX) 500 MG CAPS Take by mouth.      . pyridOXINE (VITAMIN B-6) 100 MG tablet Take 100 mg by mouth daily.      . Saw Palmetto, Serenoa repens, 450 MG CAPS Take 900 mg by mouth 2 (two) times daily.     . vitamin C (ASCORBIC ACID) 500 MG tablet Take 500 mg by mouth daily.      . VITAMIN E BLEND PO Take by mouth. 3 IU with Cranberry supplement     No current facility-administered medications for this visit.    Allergies:   Review of patient's allergies indicates no known allergies.    Social History:  The patient  reports that he has never smoked. He has never used smokeless tobacco. He reports that he does not drink alcohol or use illicit drugs.   Family History:  The patient's family history includes Coronary artery disease in his brother; Heart attack in his father; Hypertension in his brother and father; Stroke in his brother and father.    ROS:  General:no colds or fevers, no weight changes Skin:no rashes or ulcers HEENT:no blurred vision, no congestion CV:see HPI PUL:see HPI GI:no diarrhea constipation or melena, no indigestion GU:no  hematuria, no dysuria MS:hx arthritis but supplements help, no claudication Neuro:no syncope, no lightheadedness Endo:no diabetes, no thyroid disease   Wt Readings from Last 3 Encounters:  06/10/14 193 lb 1.6 oz (87.59 kg)  07/25/13 185 lb 6.4 oz (84.097 kg)  12/19/12 182 lb 12.8 oz (82.918 kg)     PHYSICAL EXAM: VS:  BP 116/78 mmHg  Pulse 66  Ht 6' (1.829 m)  Wt 193 lb 1.6 oz (87.59 kg)  BMI 26.18 kg/m2 , BMI Body mass index is 26.18 kg/(m^2). General:Pleasant affect, NAD Skin:Warm and dry, brisk capillary refill HEENT:normocephalic, sclera clear, mucus membranes moist Neck:supple, no JVD, no bruits  Heart:S1S2 RRR without murmur, gallup, rub or click Lungs:clear without rales, rhonchi, or wheezes Abd:soft, non tender, + BS, do not palpate liver spleen or masses Ext:no lower ext edema, 2+ pedal pulses, 2+ radial pulses Neuro:alert and oriented X 3, MAE, follows commands, + facial symmetry  EKG:  EKG is ordered today. The ekg ordered today demonstrates SR non specific ST abnormality but no changes from previous EKG.     Recent Labs: 07/25/2013: ALT 31; BUN 13; Creatinine 0.8; Potassium 4.2; Sodium 136    Lipid Panel    Component Value Date/Time   CHOL 178 07/25/2013 1227   TRIG 122.0 07/25/2013 1227   HDL 41.40 07/25/2013 1227   CHOLHDL 4 07/25/2013 1227   VLDL 24.4 07/25/2013 1227   LDLCALC 112* 07/25/2013 1227   LDLDIRECT 121.8 06/01/2011 1245       Other studies Reviewed: Additional studies/ records that were reviewed today include: echo ekgs old notes .   ASSESSMENT AND PLAN:  1.  Palpitations more freq episodes over last 3 weeks.   No pain.  Will check CMP, TSH free T4 and a 30 day event monitor with follow up with Dr. Jordan for results.  I did reassure that usually these "palpitations" come along and then go away.  More of a irritiation.   Discussed pseudoephedrine may cause more episodes, reviewing his supplements did not see any that would cause  palpitations.    2. HTN controlled  3.  Dizziness not associated with palpitations.  May have been inner ear, no way to tell and no further episodes.    Current medicines are reviewed with the patient today.    The patient has no concerns regarding medicines.  The following changes have been made:  See above  Labs/ tests ordered today include:see above  Disposition:   FU:  see above  Lennie Muckle, NP  06/10/2014 12:08 PM    [facility] [address] [place], [place], [place], [place] Phone: [phone]; Fax: [phone]

## 2014-06-10 NOTE — Patient Instructions (Signed)
Your physician recommends that you schedule a follow-up appointment in: 4-6 WEEKS WITH DR SwazilandJORDAN  Your physician recommends that you HAVE LAB WORK TODAY  Your physician has recommended that you wear an event monitor. Event monitors are medical devices that record the heart's electrical activity. Doctors most often us these monitors to diagnose arrhythmias. Arrhythmias are problems with the speed or rhythm of the heartbeat. The monitor is a small, portable device. You can wear one while you do your normal daily activities. This is usually used to diagnose what is causing palpitations/syncope (passing out).

## 2014-06-11 ENCOUNTER — Encounter: Payer: Self-pay | Admitting: Cardiology

## 2014-06-11 LAB — TSH: TSH: 1.955 u[IU]/mL (ref 0.350–4.500)

## 2014-06-11 LAB — COMPREHENSIVE METABOLIC PANEL
ALT: 43 U/L (ref 0–53)
AST: 27 U/L (ref 0–37)
Albumin: 4.7 g/dL (ref 3.5–5.2)
Alkaline Phosphatase: 53 U/L (ref 39–117)
BUN: 11 mg/dL (ref 6–23)
CALCIUM: 9.6 mg/dL (ref 8.4–10.5)
CO2: 28 meq/L (ref 19–32)
Chloride: 102 mEq/L (ref 96–112)
Creat: 0.75 mg/dL (ref 0.50–1.35)
Glucose, Bld: 119 mg/dL — ABNORMAL HIGH (ref 70–99)
Potassium: 4.4 mEq/L (ref 3.5–5.3)
SODIUM: 138 meq/L (ref 135–145)
Total Bilirubin: 0.9 mg/dL (ref 0.2–1.2)
Total Protein: 7.1 g/dL (ref 6.0–8.3)

## 2014-06-11 LAB — T4, FREE: FREE T4: 1 ng/dL (ref 0.80–1.80)

## 2014-06-11 LAB — MAGNESIUM: Magnesium: 2.1 mg/dL (ref 1.5–2.5)

## 2014-06-11 NOTE — Telephone Encounter (Signed)
This encounter was created in error - please disregard.

## 2014-06-11 NOTE — Telephone Encounter (Signed)
Returning your call Stanton KidneyDebra, Please call    Thanks

## 2014-07-11 ENCOUNTER — Telehealth: Payer: Self-pay | Admitting: Cardiology

## 2014-07-11 NOTE — Telephone Encounter (Signed)
Please call,question about the monitor he is wearing.

## 2014-07-11 NOTE — Telephone Encounter (Signed)
Returned call to patient he wanted to know how much longer to wear monitor.Advised to wear 30 days.Stated today is 30 days he will bring monitor back to office this afternoon.

## 2014-07-21 ENCOUNTER — Ambulatory Visit: Payer: BLUE CROSS/BLUE SHIELD | Admitting: Cardiology

## 2014-07-25 ENCOUNTER — Telehealth: Payer: Self-pay | Admitting: Cardiology

## 2014-07-25 NOTE — Telephone Encounter (Signed)
New message      Pt brought his event monitor to our office after wearing it for 30 days.  He is not getting letters from the monitor company saying he will be charged thousands of dollars because they have not received the monitor.  Please call

## 2014-08-14 ENCOUNTER — Telehealth: Payer: Self-pay

## 2014-08-14 MED ORDER — AMLODIPINE-OLMESARTAN 5-20 MG PO TABS: 1.0000 | ORAL_TABLET | Freq: Every day | ORAL | Status: DC

## 2014-08-14 NOTE — Telephone Encounter (Signed)
Azor refill sent to pharmacy.

## 2014-09-02 ENCOUNTER — Encounter: Payer: Self-pay | Admitting: Cardiology

## 2014-09-02 ENCOUNTER — Ambulatory Visit (INDEPENDENT_AMBULATORY_CARE_PROVIDER_SITE_OTHER): Payer: BLUE CROSS/BLUE SHIELD | Admitting: Cardiology

## 2014-09-02 VITALS — BP 106/70 | HR 62 | Ht 72.0 in | Wt 190.0 lb

## 2014-09-02 DIAGNOSIS — I10 Essential (primary) hypertension: Secondary | ICD-10-CM

## 2014-09-02 DIAGNOSIS — I471 Supraventricular tachycardia: Secondary | ICD-10-CM | POA: Insufficient documentation

## 2014-09-02 NOTE — Progress Notes (Signed)
Ricky Bennett Date of Birth: [birth year]   History of Present Illness: Ricky Bennett is seen for followup of his hypertension. He was seen in February with symptoms of palpitations. He wore an event monitor that showed a single episode of PSVT 14 beats at rate 130.  He continues to exercise regularly with rollerskating. He continue to take a lot of health food supplements. No chest pain or SOB.   Current Outpatient Prescriptions on File Prior to Visit  Medication Sig Dispense Refill  . Acai 500 MG CAPS Take 1,000 mg by mouth.     Marland Kitchen ALFALFA PO Take by mouth as directed.    Marland Kitchen amLODipine-olmesartan (AZOR) 5-20 MG per tablet Take 1 tablet by mouth daily. 30 tablet 6  . Arginine 500 MG CAPS Take by mouth. 400 mg    . Ascorbic Acid (VITAMIN C) 100 MG tablet Take 100 mg by mouth daily.    Marland Kitchen BIOTIN PO Take by mouth.    . cholecalciferol (VITAMIN D) 1000 UNITS tablet Take 1,000 Units by mouth daily.    . Cinnamon 500 MG TABS Take by mouth.      Marland Kitchen Cod Liver Oil 1250-130 UNITS CAPS Take by mouth.      . Coenzyme Q10 (CO Q-10) 200 MG CAPS Take by mouth.      . Cranberry 250 MG CAPS Take by mouth.      . Cyanocobalamin (B-12) 500 MCG TABS Take 1,000 mcg by mouth.     . Flaxseed, Linseed, (FLAX SEED OIL PO) Take 2,400 mg by mouth.      . folic acid (FOLVITE) 478 MCG tablet Take 400 mcg by mouth daily.     . Garlic 2956 MG CAPS Take 75 mg by mouth.     . Glucosamine-Chondroitin-Vit C 2000-1200-60 MG/30ML LIQD Take by mouth.      . Grape Seed 50 MG TABS Take by mouth.      Ricky Bennett 550 MG CAPS Take by mouth.      . Magnesium 250 MG TABS Take 500 mg by mouth.     . Niacin-Inositol 400-100 MG CAPS Take by mouth.      . Omega-3 Fatty Acids (FISH OIL) 1000 MG CPDR Take by mouth.    . peg 3350 powder (MOVIPREP) 100 G SOLR Take 1 kit by mouth as needed.    . Phytosterol Esters (PHYTOSTEROL COMPLEX) 500 MG CAPS Take by mouth.      . pyridOXINE (VITAMIN B-6) 100 MG tablet Take 100 mg by mouth daily.       . Saw Palmetto, Serenoa repens, 450 MG CAPS Take 900 mg by mouth 2 (two) times daily.     . vitamin C (ASCORBIC ACID) 500 MG tablet Take 500 mg by mouth daily.      Marland Kitchen VITAMIN E BLEND PO Take by mouth. 3 IU with Cranberry supplement     No current facility-administered medications on file prior to visit.    No Known Allergies  Past Medical History  Diagnosis Date  . HTN (hypertension)   . Prostatitis   . Hemorrhoids   . Hypertriglyceridemia     Past Surgical History  Procedure Laterality Date  . Hemorrhoid surgery  09/21/10    hemorrhoidal banding x2; Rt anterior hemorrhoidectomy  . US echocardiography  07/09/2010    History  Smoking status  . Never Smoker   Smokeless tobacco  . Never Used    History  Alcohol Use No    Family History  Problem Relation Age of Onset  . Heart attack Father   . Hypertension Father   . Stroke Father   . Hypertension Brother   . Stroke Brother   . Coronary artery disease Brother     Review of Systems: As noted in history of present illness.  He continues to take multiple supplements. All other systems were reviewed and are negative.  Physical Exam: BP 106/70 mmHg  Pulse 62  Ht 6' (1.829 m)  Wt 190 lb (86.183 kg)  BMI 25.76 kg/m2 He is a middle-aged white male in no acute distress. His HEENT exam is unremarkable. He has no JVD or bruits. There is no adenopathy. Lungs are clear. Cardiac exam reveals a regular rate and rhythm without gallop, murmur, or click. Abdomen is soft and nontender without masses or bruits. Femoral and pedal pulses are 2+ and symmetric. He is alert and oriented x3. Cranial nerves II through XII are intact. LABORATORY DATA: Lab Results  Component Value Date   WBC 14.7* 09/17/2010   HGB 14.8 09/17/2010   HCT 43.6 09/17/2010   PLT 206 09/17/2010   GLUCOSE 119* 06/10/2014   CHOL 178 07/25/2013   TRIG 122.0 07/25/2013   HDL 41.40 07/25/2013   LDLDIRECT 121.8 06/01/2011   LDLCALC 112* 07/25/2013   ALT 43  06/10/2014   AST 27 06/10/2014   NA 138 06/10/2014   K 4.4 06/10/2014   CL 102 06/10/2014   CREATININE 0.75 06/10/2014   BUN 11 06/10/2014   CO2 28 06/10/2014   TSH 1.955 06/10/2014   INR 1.1* 10/20/2011   Event monitor as noted above.   Assessment / Plan: 1. Hypertension. Blood pressures well controlled on Azor. We will continue his current therapy.  2. Hypertriglyceridemia. Improved.  3. PSVT. Minimal symptoms. I have recommended eliminating caffeine first. If symptoms persist may need to consider a beta blocker but I doubt this will be necessary.   Follow up in one year.

## 2014-09-02 NOTE — Patient Instructions (Signed)
Reduce your caffeine intake  Continue your current therapy  I will see you in one year

## 2014-11-17 ENCOUNTER — Other Ambulatory Visit: Payer: Self-pay

## 2014-11-17 MED ORDER — AMLODIPINE-OLMESARTAN 5-20 MG PO TABS: 1.0000 | ORAL_TABLET | Freq: Every day | ORAL | Status: DC

## 2014-11-18 ENCOUNTER — Telehealth: Payer: Self-pay | Admitting: Cardiology

## 2014-11-18 NOTE — Telephone Encounter (Signed)
New message      Need prior authorization on azor.  Please call [phone]

## 2014-11-19 ENCOUNTER — Telehealth: Payer: Self-pay

## 2014-11-19 NOTE — Telephone Encounter (Signed)
Prior auth sent to [facility] for Azor 5-20 via Cover My Meds.

## 2014-11-19 NOTE — Telephone Encounter (Signed)
In progress

## 2014-11-21 ENCOUNTER — Telehealth: Payer: Self-pay

## 2014-11-21 NOTE — Telephone Encounter (Signed)
Ricky Bennett has been denied by [employer]. Must try generic or other combinations. He has 2 pills left. May we do Amlodipine and Olmesartan separately?

## 2014-11-22 NOTE — Telephone Encounter (Signed)
Yes we can use amlodipine 5 mg daily and olmesartan 20 mg daily separately for equivalent of Azor.  Talton Delpriore MD, [facility]

## 2014-11-24 MED ORDER — OLMESARTAN MEDOXOMIL 20 MG PO TABS: 20.0000 mg | ORAL_TABLET | Freq: Every day | ORAL | Status: DC

## 2014-11-24 MED ORDER — AMLODIPINE BESYLATE 5 MG PO TABS
5.0000 mg | ORAL_TABLET | Freq: Every day | ORAL | Status: DC
Start: 2014-11-24 — End: 2015-06-08

## 2014-11-24 NOTE — Telephone Encounter (Signed)
Returned call to patient no answer.LMTC. 

## 2014-11-24 NOTE — Addendum Note (Signed)
Addended by: Meda Klinefelter D on: 11/24/2014 03:43 PM   Modules accepted: Orders, Medications

## 2014-11-24 NOTE — Telephone Encounter (Signed)
Returned call to patient Dr.Jordan advised to stop Azor.Start amlodipine 5 mg daily and olmesartan 20 mg daily.Prescriptions sent to pharmacy.

## 2014-11-26 ENCOUNTER — Other Ambulatory Visit: Payer: Self-pay

## 2014-11-26 MED ORDER — AMLODIPINE-OLMESARTAN 5-20 MG PO TABS: 1.0000 | ORAL_TABLET | Freq: Every day | ORAL | Status: DC

## 2014-11-26 MED ORDER — IRBESARTAN 300 MG PO TABS: 300.0000 mg | ORAL_TABLET | Freq: Every day | ORAL | Status: DC

## 2014-11-26 NOTE — Telephone Encounter (Signed)
Received a call from patient insurance denied benicar 20 mg.Stated they approved amlodipine 5 mg daily.Stated he bought 5 tablets of benicar until will do a prior authorization.  [employer] called beincar prior authorization denied.Insurance previously denied Animator. Insurance will cover losartan,telmisartan,irbesartan.Message sent to Dr.Jordan for advice.

## 2014-11-26 NOTE — Addendum Note (Signed)
Addended by: Meda Klinefelter D on: 11/26/2014 04:51 PM   Modules accepted: Medications

## 2014-11-26 NOTE — Telephone Encounter (Signed)
?   Do you want pt to continue amlodipine 5 mg daily in addition to irbesartan 300 mg daily.

## 2014-11-26 NOTE — Telephone Encounter (Signed)
Yes both amlodipine and irbesartan  Ricky Bennett [place] MD, [facility]

## 2014-11-26 NOTE — Telephone Encounter (Signed)
Instead of benicar I would do irbesartan 300 mg daily.  Peter [place] MD, [facility]

## 2014-11-26 NOTE — Telephone Encounter (Signed)
Returned call to patient Dr.Jordan advised when finished with benicar start irbesartan 300 mg daily.Continue taking amlodipine 5 mg daily.Prescription sent to pharmacy.

## 2014-12-02 ENCOUNTER — Telehealth: Payer: Self-pay | Admitting: Cardiology

## 2014-12-02 NOTE — Telephone Encounter (Signed)
Returned call to patient.Advised on 11/26/14 I called you, Dr.Jordan advised when finished with Benicar start Irbesartan 300 mg daily.Prescription was sent to pharmacy.

## 2014-12-02 NOTE — Telephone Encounter (Signed)
Pt is calling in to check the status of the pt's medication Benicar. He thinks that a prior authorization is needed in order for it to be refilled. Please f/u with pt   Thanks

## 2014-12-03 ENCOUNTER — Telehealth: Payer: Self-pay | Admitting: Cardiology

## 2014-12-03 NOTE — Telephone Encounter (Signed)
Returned call to patient he stated he called his insurance agent and International aid/development worker.Advised generic benicar not available until the end of this year.Advised to take Irbesartan 300 mg daily.

## 2014-12-03 NOTE — Telephone Encounter (Signed)
Returned call to patient he stated he wants to keep taking benicar.Stated insurance agent advised him to have [place] call and do a pier to pier review to get insurance to cover benicar.Advised pt I will call this week and do a pier to pier review for benicar.

## 2014-12-03 NOTE — Telephone Encounter (Signed)
Cheryl Mr. Docken is wanting you to give him a call . Thanks

## 2014-12-03 NOTE — Telephone Encounter (Signed)
Returned call to patient no answer.LMTC. 

## 2014-12-03 NOTE — Telephone Encounter (Signed)
Per Answering Service: Calling for Ricky Bennett,concerning getting RX authorized.

## 2014-12-05 NOTE — Telephone Encounter (Signed)
Insurance was called at [phone].Form was faxed and completed to approve benicar.Form faxed back to fax # [phone].

## 2014-12-08 NOTE — Telephone Encounter (Signed)
Received call back from patient. [employer] denied payment for benicar.Advised has to try and fail irbesartan,losartan,valsartan.Irbesartan 300 mg daily prescription was sent to pharmacy 11/26/14.

## 2014-12-08 NOTE — Telephone Encounter (Signed)
Patient called no answer.LMTC. 

## 2015-01-12 ENCOUNTER — Ambulatory Visit (INDEPENDENT_AMBULATORY_CARE_PROVIDER_SITE_OTHER): Payer: BLUE CROSS/BLUE SHIELD | Admitting: Physician Assistant

## 2015-01-12 VITALS — BP 112/76 | HR 58 | Temp 97.9°F | Resp 16 | Ht 71.0 in | Wt 188.2 lb

## 2015-01-12 DIAGNOSIS — Z114 Encounter for screening for human immunodeficiency virus [HIV]: Secondary | ICD-10-CM | POA: Diagnosis not present

## 2015-01-12 DIAGNOSIS — T7840XA Allergy, unspecified, initial encounter: Secondary | ICD-10-CM

## 2015-01-12 DIAGNOSIS — Z23 Encounter for immunization: Secondary | ICD-10-CM

## 2015-01-12 DIAGNOSIS — H6123 Impacted cerumen, bilateral: Secondary | ICD-10-CM | POA: Diagnosis not present

## 2015-01-12 MED ORDER — EPINEPHRINE 0.3 MG/0.3ML IJ SOAJ: 0.3000 mg | Freq: Once | INTRAMUSCULAR | Status: AC

## 2015-01-12 NOTE — Progress Notes (Signed)
Subjective:     Patient ID: Ricky Bennett, male   DOB: 07/13/1960, 54 y.o.   MRN: [id number] PCP: No primary care provider on file.   Chief Complaint  Patient presents with  . Cerumen Impaction    x 1 day   . other    pt. wants an epi-pen      HPI Patient went swimming yesterday and feels that he needs to have his ears flushed. He states he has decreased hearing in both ears from wax build up. No ear pain. No drainage.   He also states that he needs an epi pen because he is "extremely allergic to things." He has been told by various doctors that he "really needs an epi pen" but wasn't comfortable with getting one. Around 4-5 years ago, he had a severe allergic reaction to a jelly fish sting where he broke out into a diffuse rash, developed a violent cough, and felt that his throat was constricted. Whenever he gets bitten by ants or stung by a bee he will have similar reactions, although not as bad. He states that he gets an "asthma feeling and a burning in my chest" whenever he is having an allergic reaction and wherever he is bit or stung gets swollen quite a bit. No lip, eyelid or facial swelling. He has not taken Benadryl or any other medication when these episodes occur.     He is followed by cardiology for HTN and PSVT. He has not experienced symptoms with his PSVT for several months now.    Review of Systems  HENT: Positive for ear pain (Right ear is "uncomfortable") and hearing loss (In both ears due to "ears being stuffed up"). Negative for ear discharge.   See HPI  Patient Active Problem List   Diagnosis Date Noted  . PSVT (paroxysmal supraventricular tachycardia) 09/02/2014  . Diverticulosis of colon 06/28/2011  . Elevated transaminase level 06/13/2011  . HTN (hypertension)   . Hypertriglyceridemia     Prior to Admission medications   Medication Sig Start Date End Date Taking? Authorizing Provider  Acai 500 MG CAPS Take 1,000 mg by mouth.    Yes Historical Provider, MD   ALFALFA PO Take by mouth as directed.   Yes Historical Provider, MD  amLODipine (NORVASC) 5 MG tablet Take 1 tablet (5 mg total) by mouth daily. 11/24/14  Yes Peter M Martinique, MD  Arginine 500 MG CAPS Take by mouth. 400 mg   Yes Historical Provider, MD  Ascorbic Acid (VITAMIN C) 100 MG tablet Take 100 mg by mouth daily.   Yes Historical Provider, MD  BIOTIN PO Take by mouth.   Yes Historical Provider, MD  cholecalciferol (VITAMIN D) 1000 UNITS tablet Take 1,000 Units by mouth daily.   Yes Historical Provider, MD  Cinnamon 500 MG TABS Take by mouth.     Yes Historical Provider, MD  [facility] Liver Oil 1250-130 UNITS CAPS Take by mouth.     Yes Historical Provider, MD  Coenzyme Q10 (CO Q-10) 200 MG CAPS Take by mouth.     Yes Historical Provider, MD  Cranberry 250 MG CAPS Take by mouth.     Yes Historical Provider, MD  Cyanocobalamin (B-12) 500 MCG TABS Take 1,000 mcg by mouth.    Yes Historical Provider, MD  Flaxseed, Linseed, (FLAX SEED OIL PO) Take 2,400 mg by mouth.     Yes Historical Provider, MD  folic acid (FOLVITE) 245 MCG tablet Take 400 mcg by mouth daily.    Yes  Historical Provider, MD  Garlic 5465 MG CAPS Take 75 mg by mouth.    Yes Historical Provider, MD  Glucosamine-Chondroitin-Vit C 2000-1200-60 MG/30ML LIQD Take by mouth.     Yes Historical Provider, MD  Grape Seed 50 MG TABS Take by mouth.     Yes Historical Provider, MD  Cathrine Muster 550 MG CAPS Take by mouth.     Yes Historical Provider, MD  irbesartan (AVAPRO) 300 MG tablet Take 1 tablet (300 mg total) by mouth daily. 11/26/14  Yes Peter M Martinique, MD  Magnesium 250 MG TABS Take 500 mg by mouth.    Yes Historical Provider, MD  Niacin-Inositol 400-100 MG CAPS Take by mouth.     Yes Historical Provider, MD  Omega-3 Fatty Acids (FISH OIL) 1000 MG CPDR Take by mouth.   Yes Historical Provider, MD  peg 3350 powder (MOVIPREP) 100 G SOLR Take 1 kit by mouth as needed. 10/21/11  Yes Jerene Bears, MD  Phytosterol Esters (PHYTOSTEROL COMPLEX)  500 MG CAPS Take by mouth.     Yes Historical Provider, MD  pyridOXINE (VITAMIN B-6) 100 MG tablet Take 100 mg by mouth daily.     Yes Historical Provider, MD  Saw Palmetto, Serenoa repens, 450 MG CAPS Take 900 mg by mouth 2 (two) times daily.    Yes Historical Provider, MD  vitamin C (ASCORBIC ACID) 500 MG tablet Take 500 mg by mouth daily.     Yes Historical Provider, MD  VITAMIN E BLEND PO Take by mouth. 3 IU with Cranberry supplement   Yes Historical Provider, MD    No Known Allergies    Objective:  Physical Exam  Constitutional: He is oriented to person, place, and time. He appears well-developed and well-nourished.  HENT:  Head: Normocephalic and atraumatic.  Unable to visualize TMs due to cerumen impaction.  Eyes: Conjunctivae are normal. Pupils are equal, round, and reactive to light.  Neck: Normal range of motion. Neck supple.  Cardiovascular: Normal rate and regular rhythm.   Pulmonary/Chest: Effort normal and breath sounds normal.  Neurological: He is alert and oriented to person, place, and time.  Skin: Skin is warm and dry.  Psychiatric: He has a normal mood and affect. His behavior is normal. Thought content normal.     BP 112/76 mmHg  Pulse 58  Temp(Src) 97.9 F (36.6 C) (Oral)  Resp 16  Ht $R'5\' 11"'as$  (1.803 m)  Wt 188 lb 3.2 oz (85.367 kg)  BMI 26.26 kg/m2  SpO2 98%   Assessment & Plan:  1. Cerumen impaction, bilateral Disimpacted in the office. TMs visualized after disimpaction and are within normal limits.   2. Allergic reaction, initial encounter Patient counseled that if he develops lung symptoms (such as trouble breathing/throat swelling) when he is having an allergic reaction, that he should use the Epi pen and call 911. He was counseled that the Epi pen is not a treatment by itself, and he should seek emergency services if he ever needs to use it. Counseled on using Benadryl for milder allergy symptoms.  - EPINEPHrine 0.3 mg/0.3 mL IJ SOAJ injection;  Inject 0.3 mLs (0.3 mg total) into the muscle once.  Dispense: 2 Device; Refill: 1  3. Need for Tdap vaccination - Tdap vaccine greater than or equal to 7yo IM  4. Screening for HIV (human immunodeficiency virus)    Amber D. Race, PA-S Physician Assistant Student [facility]   - HIV antibody

## 2015-01-12 NOTE — Progress Notes (Signed)
Patient ID: Ricky Bennett, male    DOB: 10/12/1960, 54 y.o.   MRN: [id number]  PCP: No primary care provider on file.  Chief Complaint  Patient presents with  . Cerumen Impaction    x 1 day   . other    pt. wants an epi-pen     Subjective:  HPI Patient went swimming yesterday and feels that he needs to have his ears flushed.   He states he has decreased hearing in both ears from wax build up. No ear pain. No drainage.   He also states that he needs an epi pen because he is "extremely allergic to things." He has been told by various doctors that he "really needs an epi pen" but wasn't comfortable with getting one. Around 4-5 years ago, he had a severe allergic reaction to a jelly fish sting where he broke out into a diffuse rash, developed a violent cough, and felt that his throat was constricted. Whenever he gets bitten by ants or stung by a bee he will have similar reactions, although not as bad. He states that he gets an "asthma feeling and a burning in my chest" whenever he is having an allergic reaction and wherever he is bit or stung gets swollen quite a bit. No lip, eyelid or facial swelling. He has not taken Benadryl or any other medication when these episodes occur.   He is followed by cardiology for HTN and PSVT. He has not experienced symptoms with his PSVT for several months now.       Review of Systems  Constitutional: Negative.   HENT: Positive for hearing loss. Negative for ear discharge, ear pain, facial swelling, sinus pressure, sore throat, trouble swallowing and voice change.   Eyes: Negative for photophobia and visual disturbance.  Respiratory: Negative for cough, chest tightness, shortness of breath and wheezing.   Cardiovascular: Negative for chest pain, palpitations and leg swelling.  Neurological: Negative for dizziness and headaches.       Patient Active Problem List   Diagnosis Date Noted  . PSVT (paroxysmal supraventricular tachycardia) 09/02/2014    . Diverticulosis of colon 06/28/2011  . Elevated transaminase level 06/13/2011  . HTN (hypertension)   . Hypertriglyceridemia     No Known Allergies  Prior to Admission medications   Medication Sig Start Date End Date Taking? Authorizing Provider  Acai 500 MG CAPS Take 1,000 mg by mouth.    Yes Historical Provider, MD  ALFALFA PO Take by mouth as directed.   Yes Historical Provider, MD  amLODipine (NORVASC) 5 MG tablet Take 1 tablet (5 mg total) by mouth daily. 11/24/14  Yes Peter M [place], MD  Arginine 500 MG CAPS Take by mouth. 400 mg   Yes Historical Provider, MD  Ascorbic Acid (VITAMIN C) 100 MG tablet Take 100 mg by mouth daily.   Yes Historical Provider, MD  BIOTIN PO Take by mouth.   Yes Historical Provider, MD  cholecalciferol (VITAMIN D) 1000 UNITS tablet Take 1,000 Units by mouth daily.   Yes Historical Provider, MD  Cinnamon 500 MG TABS Take by mouth.     Yes Historical Provider, MD  [facility] Liver Oil 1250-130 UNITS CAPS Take by mouth.     Yes Historical Provider, MD  Coenzyme Q10 (CO Q-10) 200 MG CAPS Take by mouth.     Yes Historical Provider, MD  Cranberry 250 MG CAPS Take by mouth.     Yes Historical Provider, MD  Cyanocobalamin (B-12) 500 MCG TABS Take 1,000  mcg by mouth.    Yes Historical Provider, MD  Flaxseed, Linseed, (FLAX SEED OIL PO) Take 2,400 mg by mouth.     Yes Historical Provider, MD  folic acid (FOLVITE) 170 MCG tablet Take 400 mcg by mouth daily.    Yes Historical Provider, MD  Garlic 0174 MG CAPS Take 75 mg by mouth.    Yes Historical Provider, MD  Glucosamine-Chondroitin-Vit C 2000-1200-60 MG/30ML LIQD Take by mouth.     Yes Historical Provider, MD  Grape Seed 50 MG TABS Take by mouth.     Yes Historical Provider, MD  Cathrine Muster 550 MG CAPS Take by mouth.     Yes Historical Provider, MD  irbesartan (AVAPRO) 300 MG tablet Take 1 tablet (300 mg total) by mouth daily. 11/26/14  Yes Peter M Martinique, MD  Magnesium 250 MG TABS Take 500 mg by mouth.    Yes  Historical Provider, MD  Niacin-Inositol 400-100 MG CAPS Take by mouth.     Yes Historical Provider, MD  Omega-3 Fatty Acids (FISH OIL) 1000 MG CPDR Take by mouth.   Yes Historical Provider, MD  peg 3350 powder (MOVIPREP) 100 G SOLR Take 1 kit by mouth as needed. 10/21/11  Yes Jerene Bears, MD  Phytosterol Esters (PHYTOSTEROL COMPLEX) 500 MG CAPS Take by mouth.     Yes Historical Provider, MD  pyridOXINE (VITAMIN B-6) 100 MG tablet Take 100 mg by mouth daily.     Yes Historical Provider, MD  Saw Palmetto, Serenoa repens, 450 MG CAPS Take 900 mg by mouth 2 (two) times daily.    Yes Historical Provider, MD  vitamin C (ASCORBIC ACID) 500 MG tablet Take 500 mg by mouth daily.     Yes Historical Provider, MD  VITAMIN E BLEND PO Take by mouth. 3 IU with Cranberry supplement   Yes Historical Provider, MD  EPINEPHrine 0.3 mg/0.3 mL IJ SOAJ injection Inject 0.3 mLs (0.3 mg total) into the muscle once. 01/12/15   Harrison Mons, PA-C     Past Medical, Surgical Family and Social History reviewed and updated.        Objective:  Physical Exam  Constitutional: He is oriented to person, place, and time. He appears well-developed and well-nourished. He is active and cooperative. No distress.  BP 112/76 mmHg  Pulse 58  Temp(Src) 97.9 F (36.6 C) (Oral)  Resp 16  Ht $R'5\' 11"'KF$  (1.803 m)  Wt 188 lb 3.2 oz (85.367 kg)  BMI 26.26 kg/m2  SpO2 98%   HENT:  Bilateral cerumen impaction resolved with irrigation and manual debridement (on the LEFT), revealing normal canals and TMs bilaterally.  Eyes: Conjunctivae are normal.  Pulmonary/Chest: Effort normal.  Neurological: He is alert and oriented to person, place, and time.  Psychiatric: He has a normal mood and affect. His speech is normal and behavior is normal.           Assessment & Plan:  1. Cerumen impaction, bilateral Resolved.  2. Allergic reaction, initial encounter Counseled on what to do in the event of an allergic reaction, including  seeking prompt evaluation for any symptoms involving the mouth, throat or chest. - EPINEPHrine 0.3 mg/0.3 mL IJ SOAJ injection; Inject 0.3 mLs (0.3 mg total) into the muscle once.  Dispense: 2 Device; Refill: 1  3. Need for Tdap vaccination - Tdap vaccine greater than or equal to 56yo IM  4. Screening for HIV (human immunodeficiency virus) - HIV antibody   Fara Chute, PA-C Physician Assistant-Certified [facility]

## 2015-01-12 NOTE — Patient Instructions (Addendum)
If you use the epi-pen, you must ALSO call 911 immediately. The benefit of the epi-pen is brief, and designed to allow you time to seek/receive emergency care. The epi-pen is NOT definitive treatment for an allergic reaction.  Contact your gastroenterologist and schedule your colonoscopy, if you're due. Generally, you need one every 10 years. However, there are some people who need one more frequently.  Please do not insert anything into your ear that is smaller than your elbow.  This includes QTips, keys, hair pins, etc.   If your ears produce extra wax, you can help reduce the build up by:  1) allow the soapy water to drip down into your ear canals when you wash your hair (it can dissolve the wax the same way that dishwashing liquid dissolves grease), and/or  2) mix Hydrogen Peroxide half-and-half with water (DO NOT USE HYDROGEN PEROXIDE WITHOUT DILUTING IT WITH WATER as it can burn the skin in your ear); pour a little of this mixture into each ear canal while in the shower 2-3 times each week.  If your ears are itchy, place several drops of Sweet Oil into each canal to soothe the itching.  If it's not effective, place a small amount of hydrocortisone ointment on the tip of your pinky finger and rub it gently in the ear canal.  The heat of your body will melt the ointment, allowing it to spread in your ear canal and reduce the itching. Tdap Vaccine (Tetanus, Diphtheria, Pertussis): What You Need to Know 1. Why get vaccinated? Tetanus, diphtheria and pertussis can be very serious diseases, even for adolescents and adults. Tdap vaccine can protect [place] from these diseases. TETANUS (Lockjaw) causes painful muscle tightening and stiffness, usually all over the body.  It can lead to tightening of muscles in the head and neck so you can't open your mouth, swallow, or sometimes even breathe. Tetanus kills about 1 out of 5 people who are infected. DIPHTHERIA can cause a thick coating to form in the back  of the throat.  It can lead to breathing problems, paralysis, heart failure, and death. PERTUSSIS (Whooping Cough) causes severe coughing spells, which can cause difficulty breathing, vomiting and disturbed sleep.  It can also lead to weight loss, incontinence, and rib fractures. Up to 2 in 100 adolescents and 5 in 100 adults with pertussis are hospitalized or have complications, which could include pneumonia or death. These diseases are caused by bacteria. Diphtheria and pertussis are spread from person to person through coughing or sneezing. Tetanus enters the body through cuts, scratches, or wounds. Before vaccines, the [place] saw as many as 200,000 cases a year of diphtheria and pertussis, and hundreds of cases of tetanus. Since vaccination began, tetanus and diphtheria have dropped by about 99% and pertussis by about 80%. 2. Tdap vaccine Tdap vaccine can protect adolescents and adults from tetanus, diphtheria, and pertussis. One dose of Tdap is routinely given at age 47 or 10. People who did not get Tdap at that age should get it as soon as possible. Tdap is especially important for health care professionals and anyone having close contact with a baby younger than 12 months. Pregnant women should get a dose of Tdap during every pregnancy, to protect the newborn from pertussis. Infants are most at risk for severe, life-threatening complications from pertussis. A similar vaccine, called Td, protects from tetanus and diphtheria, but not pertussis. A Td booster should be given every 10 years. Tdap may be given as one of  these boosters if you have not already gotten a dose. Tdap may also be given after a severe cut or burn to prevent tetanus infection. Your doctor can give you more information. Tdap may safely be given at the same time as other vaccines. 3. Some people should not get this vaccine  If you ever had a life-threatening allergic reaction after a dose of any tetanus, diphtheria, or  pertussis containing vaccine, OR if you have a severe allergy to any part of this vaccine, you should not get Tdap. Tell your doctor if you have any severe allergies.  If you had a coma, or long or multiple seizures within 7 days after a childhood dose of DTP or DTaP, you should not get Tdap, unless a cause other than the vaccine was found. You can still get Td.  Talk to your doctor if you:  have epilepsy or another nervous system problem,  had severe pain or swelling after any vaccine containing diphtheria, tetanus or pertussis,  ever had Guillain-Barr Syndrome (GBS),  aren't feeling well on the day the shot is scheduled. 4. Risks of a vaccine reaction With any medicine, including vaccines, there is a chance of side effects. These are usually mild and go away on their own, but serious reactions are also possible. Brief fainting spells can follow a vaccination, leading to injuries from falling. Sitting or lying down for about 15 minutes can help prevent these. Tell your doctor if you feel dizzy or light-headed, or have vision changes or ringing in the ears. Mild problems following Tdap (Did not interfere with activities)  Pain where the shot was given (about 3 in 4 adolescents or 2 in 3 adults)  Redness or swelling where the shot was given (about 1 person in 5)  Mild fever of at least 100.6F (up to about 1 in 25 adolescents or 1 in 100 adults)  Headache (about 3 or 4 people in 10)  Tiredness (about 1 person in 3 or 4)  Nausea, vomiting, diarrhea, stomach ache (up to 1 in 4 adolescents or 1 in 10 adults)  Chills, body aches, sore joints, rash, swollen glands (uncommon) Moderate problems following Tdap (Interfered with activities, but did not require medical attention)  Pain where the shot was given (about 1 in 5 adolescents or 1 in 100 adults)  Redness or swelling where the shot was given (up to about 1 in 16 adolescents or 1 in 25 adults)  Fever over 102F (about 1 in 100  adolescents or 1 in 250 adults)  Headache (about 3 in 20 adolescents or 1 in 10 adults)  Nausea, vomiting, diarrhea, stomach ache (up to 1 or 3 people in 100)  Swelling of the entire arm where the shot was given (up to about 3 in 100). Severe problems following Tdap (Unable to perform usual activities; required medical attention)  Swelling, severe pain, bleeding and redness in the arm where the shot was given (rare). A severe allergic reaction could occur after any vaccine (estimated less than 1 in a million doses). 5. What if there is a serious reaction? What should I look for?  Look for anything that concerns you, such as signs of a severe allergic reaction, very high fever, or behavior changes. Signs of a severe allergic reaction can include hives, swelling of the face and throat, difficulty breathing, a fast heartbeat, dizziness, and weakness. These would start a few minutes to a few hours after the vaccination. What should I do?  If you think it  is a severe allergic reaction or other emergency that can't wait, call 9-1-1 or get the person to the nearest hospital. Otherwise, call your doctor.  Afterward, the reaction should be reported to the "Vaccine Adverse Event Reporting System" (VAERS). Your doctor might file this report, or you can do it yourself through the VAERS web site at www.vaers.LAgents.no, or by calling 1-(864)077-2559. VAERS is only for reporting reactions. They do not give medical advice.  6. The National Vaccine Injury Compensation Program The [employer] Vaccine Injury Compensation Program (VICP) is a federal program that was created to compensate people who may have been injured by certain vaccines. Persons who believe they may have been injured by a vaccine can learn about the program and about filing a claim by calling 1-802-607-9955 or visiting the VICP website at SpiritualWord.at. 7. How can I learn more?  Ask your doctor.  Call your local or state  health department.  Contact the Centers for Disease Control and Prevention (CDC):  Call (434)406-9188 or visit CDC's website at PicCapture.uy. CDC Tdap Vaccine VIS (09/01/11) Document Released: 10/11/2011 Document Revised: 08/26/2013 Document Reviewed: 07/24/2013 ExitCare Patient Information 2015 [place], [place]. This information is not intended to replace advice given to you by your health care provider. Make sure you discuss any questions you have with your health care provider.

## 2015-01-13 LAB — HIV ANTIBODY (ROUTINE TESTING W REFLEX): HIV 1&2 Ab, 4th Generation: NONREACTIVE

## 2015-01-14 ENCOUNTER — Encounter: Payer: Self-pay | Admitting: Physician Assistant

## 2015-01-27 NOTE — Progress Notes (Signed)
  Medical screening examination/treatment/procedure(s) were performed by non-physician practitioner and as supervising physician I was immediately available for consultation/collaboration.     

## 2015-02-25 ENCOUNTER — Other Ambulatory Visit: Payer: Self-pay

## 2015-02-25 MED ORDER — IRBESARTAN 300 MG PO TABS: 300.0000 mg | ORAL_TABLET | Freq: Every day | ORAL | Status: DC

## 2015-03-27 ENCOUNTER — Other Ambulatory Visit: Payer: Self-pay | Admitting: *Deleted

## 2015-03-27 MED ORDER — IRBESARTAN 300 MG PO TABS: 300.0000 mg | ORAL_TABLET | Freq: Every day | ORAL | Status: DC

## 2015-03-31 ENCOUNTER — Other Ambulatory Visit: Payer: Self-pay | Admitting: Cardiology

## 2015-03-31 MED ORDER — IRBESARTAN 300 MG PO TABS: 300.0000 mg | ORAL_TABLET | Freq: Every day | ORAL | Status: DC

## 2015-04-14 ENCOUNTER — Other Ambulatory Visit: Payer: Self-pay

## 2015-04-14 ENCOUNTER — Telehealth: Payer: Self-pay | Admitting: Internal Medicine

## 2015-04-14 NOTE — Telephone Encounter (Signed)
Patient has not been seen in 3+ years Had colonoscopy scheduled which he no showed and second colonoscopy rescheduled which was canceled by patient He needs to be seen prior to any treatment, he can be offered appointment with us or be seen by urgent care or emergency department

## 2015-04-14 NOTE — Telephone Encounter (Signed)
Pt states he thinks he is having a diverticulitis flare. Reports he is having a lot of pain on his left side and when he passes some gas he passes some clear mucous with some solid waste mixed in. Pt states he does not have a fever. Please advise.

## 2015-04-14 NOTE — Telephone Encounter (Signed)
Pt is calling back and wants to know if he can take something OTC

## 2015-04-14 NOTE — Telephone Encounter (Signed)
Spoke with pt and he is aware. Pt scheduled to see Lori Hvozdovic, PA-C 04/16/15@2 :15pm. Pt aware of appt.

## 2015-04-16 ENCOUNTER — Encounter: Payer: Self-pay | Admitting: Physician Assistant

## 2015-04-16 ENCOUNTER — Ambulatory Visit (INDEPENDENT_AMBULATORY_CARE_PROVIDER_SITE_OTHER): Payer: BLUE CROSS/BLUE SHIELD | Admitting: Physician Assistant

## 2015-04-16 ENCOUNTER — Other Ambulatory Visit (INDEPENDENT_AMBULATORY_CARE_PROVIDER_SITE_OTHER): Payer: BLUE CROSS/BLUE SHIELD

## 2015-04-16 VITALS — BP 104/72 | HR 80 | Temp 98.0°F | Ht 71.75 in | Wt 184.0 lb

## 2015-04-16 DIAGNOSIS — K5730 Diverticulosis of large intestine without perforation or abscess without bleeding: Secondary | ICD-10-CM | POA: Diagnosis not present

## 2015-04-16 DIAGNOSIS — K589 Irritable bowel syndrome without diarrhea: Secondary | ICD-10-CM

## 2015-04-16 LAB — CBC WITH DIFFERENTIAL/PLATELET
BASOS ABS: 0 10*3/uL (ref 0.0–0.1)
BASOS PCT: 0.4 % (ref 0.0–3.0)
EOS ABS: 0.1 10*3/uL (ref 0.0–0.7)
Eosinophils Relative: 1.7 % (ref 0.0–5.0)
HEMATOCRIT: 44.2 % (ref 39.0–52.0)
Hemoglobin: 14.8 g/dL (ref 13.0–17.0)
LYMPHS PCT: 16.3 % (ref 12.0–46.0)
Lymphs Abs: 1.2 10*3/uL (ref 0.7–4.0)
MCHC: 33.4 g/dL (ref 30.0–36.0)
MCV: 94.8 fl (ref 78.0–100.0)
MONO ABS: 0.5 10*3/uL (ref 0.1–1.0)
Monocytes Relative: 6.7 % (ref 3.0–12.0)
NEUTROS ABS: 5.7 10*3/uL (ref 1.4–7.7)
NEUTROS PCT: 74.9 % (ref 43.0–77.0)
PLATELETS: 214 10*3/uL (ref 150.0–400.0)
RBC: 4.66 Mil/uL (ref 4.22–5.81)
RDW: 12.8 % (ref 11.5–15.5)
WBC: 7.6 10*3/uL (ref 4.0–10.5)

## 2015-04-16 MED ORDER — METRONIDAZOLE 500 MG PO TABS: 500.0000 mg | ORAL_TABLET | Freq: Two times a day (BID) | ORAL | Status: DC

## 2015-04-16 MED ORDER — NA SULFATE-K SULFATE-MG SULF 17.5-3.13-1.6 GM/177ML PO SOLN: 1.0000 | Freq: Once | ORAL | Status: DC

## 2015-04-16 MED ORDER — CIPROFLOXACIN HCL 500 MG PO TABS: 500.0000 mg | ORAL_TABLET | Freq: Two times a day (BID) | ORAL | Status: DC

## 2015-04-16 MED ORDER — DICYCLOMINE HCL 10 MG PO CAPS: 10.0000 mg | ORAL_CAPSULE | Freq: Three times a day (TID) | ORAL | Status: DC | PRN

## 2015-04-16 NOTE — Patient Instructions (Signed)
You have been scheduled for a colonoscopy. Please follow written instructions given to you at your visit today.  Please pick up your prep supplies at the pharmacy within the next 1-3 days. If you use inhalers (even only as needed), please bring them with you on the day of your procedure. Your physician has requested that you go to www.startemmi.com and enter the access code given to you at your visit today. This web site gives a general overview about your procedure. However, you should still follow specific instructions given to you by our office regarding your preparation for the procedure.  We have sent medications to your pharmacy for you to pick up at your convenience.  We have given you a written Rx for Cipro and Flagyl.  Your provider has requested that you go to the basement for lab work before leaving today.

## 2015-04-16 NOTE — Progress Notes (Addendum)
Patient ID: Ricky Bennett, male   DOB: [birth year], 54 y.o.   MRN: [id number]    HPI:  Ricky Bennett is a 54 y.o.   male  Who has previously been evaluated by Dr. Elmo Putt. He was last seen in June 2013.  In February 2013, he was clinically diagnosed with diverticulitis and treated with a course of Cipro and Flagyl. Review CT scan did show the presence of diverticulosis. Patient has never had a colonoscopy. In June of 2013 Dr. Elmo Putt discussed scheduling colonoscopy both to screen for colorectal cancer as well as to directly visualize his colon given his episode of diverticulitis. The patient no showed for the procedure. He was subsequently rescheduled and called and canceled. He presents today stating that over the past year he has had intermittent episodes of left lower quadrant pain. This past Monday however the pain became quite severe and began to radiate across the lower abdomen. He is been very gassy and his stools have been mushy he has had no bright red blood her rectum or melena. He has had no fever or chills and denies nausea or vomiting. He states it feels like he has diverticulitis again.   Past Medical History  Diagnosis Date  . HTN (hypertension)   . Prostatitis   . Hemorrhoids   . Hypertriglyceridemia     Past Surgical History  Procedure Laterality Date  . Hemorrhoid surgery  09/21/10    hemorrhoidal banding x2; Rt anterior hemorrhoidectomy  . US echocardiography  07/09/2010  . Skin cancer excision      back   Family History  Problem Relation Age of Onset  . Heart attack Father   . Hypertension Father   . Stroke Father   . Hypertension Brother   . Stroke Brother   . Coronary artery disease Brother   . Stroke Sister    Social History  Substance Use Topics  . Smoking status: Never Smoker   . Smokeless tobacco: Never Used  . Alcohol Use: No   Current Outpatient Prescriptions  Medication Sig Dispense Refill  . Acai 500 MG CAPS Take 1,000 mg by mouth.     Marland Kitchen ALFALFA PO  Take by mouth as directed.    Marland Kitchen amLODipine (NORVASC) 5 MG tablet Take 1 tablet (5 mg total) by mouth daily. 30 tablet 6  . Arginine 500 MG CAPS Take by mouth. 400 mg    . Ascorbic Acid (VITAMIN C) 100 MG tablet Take 100 mg by mouth daily.    Marland Kitchen BIOTIN PO Take by mouth.    . cholecalciferol (VITAMIN D) 1000 UNITS tablet Take 1,000 Units by mouth daily.    . Cinnamon 500 MG TABS Take by mouth.      Marland Kitchen Cod Liver Oil 1250-130 UNITS CAPS Take by mouth.      . Coenzyme Q10 (CO Q-10) 200 MG CAPS Take by mouth.      . Cranberry 250 MG CAPS Take by mouth.      . Cyanocobalamin (B-12) 500 MCG TABS Take 1,000 mcg by mouth.     . EPINEPHrine 0.3 mg/0.3 mL IJ SOAJ injection Inject 0.3 mLs (0.3 mg total) into the muscle once. 2 Device 1  . Flaxseed, Linseed, (FLAX SEED OIL PO) Take 2,400 mg by mouth.      . folic acid (FOLVITE) 858 MCG tablet Take 400 mcg by mouth daily.     . Garlic 8502 MG CAPS Take 75 mg by mouth.     . Glucosamine-Chondroitin-Vit C 2000-1200-60 MG/30ML  LIQD Take by mouth.      . Grape Seed 50 MG TABS Take by mouth.      Cathrine Bennett 550 MG CAPS Take by mouth.      . irbesartan (AVAPRO) 300 MG tablet Take 1 tablet (300 mg total) by mouth daily. 30 tablet 5  . Magnesium 250 MG TABS Take 500 mg by mouth.     . Niacin-Inositol 400-100 MG CAPS Take by mouth.      . Omega-3 Fatty Acids (FISH OIL) 1000 MG CPDR Take by mouth.    . Phytosterol Esters (PHYTOSTEROL COMPLEX) 500 MG CAPS Take by mouth.      . pyridOXINE (VITAMIN B-6) 100 MG tablet Take 100 mg by mouth daily.      . Saw Palmetto, Serenoa repens, 450 MG CAPS Take 900 mg by mouth 2 (two) times daily.     . vitamin C (ASCORBIC ACID) 500 MG tablet Take 500 mg by mouth daily.      Marland Kitchen VITAMIN E BLEND PO Take by mouth. 3 IU with Cranberry supplement    . ciprofloxacin (CIPRO) 500 MG tablet Take 1 tablet (500 mg total) by mouth 2 (two) times daily. 20 tablet 0  . dicyclomine (BENTYL) 10 MG capsule Take 1 capsule (10 mg total) by mouth  3 (three) times daily as needed for spasms. 90 capsule 1  . metroNIDAZOLE (FLAGYL) 500 MG tablet Take 1 tablet (500 mg total) by mouth 2 (two) times daily. 20 tablet 0  . Na Sulfate-K Sulfate-Mg Sulf SOLN Take 1 kit by mouth once. 354 mL 0   No current facility-administered medications for this visit.   Allergies  Allergen Reactions  . Bee Venom      Review of Systems: Gen: Denies any fever, chills, sweats, anorexia, fatigue, weakness, malaise, weight loss, and sleep disorder CV: Denies chest pain, angina, palpitations, syncope, orthopnea, PND, peripheral edema, and claudication. Resp: Denies dyspnea at rest, dyspnea with exercise, cough, sputum, wheezing, coughing up blood, and pleurisy. GI: Denies vomiting blood, jaundice, and fecal incontinence.   Denies dysphagia or odynophagia. GU : Denies urinary burning, blood in urine, urinary frequency, urinary hesitancy, nocturnal urination, and urinary incontinence. MS: Denies joint pain, limitation of movement, and swelling, stiffness, low back pain, extremity pain. Denies muscle weakness, cramps, atrophy.  Derm: Denies rash, itching, dry skin, hives, moles, warts, or unhealing ulcers.  Psych: Denies depression, anxiety, memory loss, suicidal ideation, hallucinations, paranoia, and confusion. Heme: Denies bruising, bleeding, and enlarged lymph nodes. Neuro:  Denies any headaches, dizziness, paresthesias. Endo:  Denies any problems with DM, thyroid, adrenal function    Physical Exam: BP 104/72 mmHg  Pulse 80  Temp(Src) 98 F (36.7 C)  Ht 5' 11.75" (1.822 m)  Wt 184 lb (83.462 kg)  BMI 25.14 kg/m2 Constitutional: Pleasant,well-developed,  Caucasianmale in no acute distress. HEENT: Normocephalic and atraumatic. Conjunctivae are normal. No scleral icterus. Neck supple.  JVD Cardiovascular: Normal rate, regular rhythm.  Pulmonary/chest: Effort normal and breath sounds normal. No wheezing, rales or rhonchi. Abdominal: Soft, nondistended,   Tender to palpation left lower quadrant and suprapubic area.. Bowel sounds active throughout. There are no masses palpable. No hepatomegaly. Extremities: no edema Lymphadenopathy: No cervical adenopathy noted. Neurological: Alert and oriented to person place and time. Skin: Skin is warm and dry. No rashes noted. Psychiatric: Normal mood and affect. Behavior is normal.  ASSESSMENT AND PLAN:  54 year old male with a known history of diverticular disease presenting with a 4 day history of  left lower quadrant pain and mushy stools. Clinically he appears to have diverticulitis. We have discussed a repeat course of Cipro and Flagyl , however the patient states he thinks he might be a little better today and we'll hold on the antibiotics. A CBC with differential will be obtained. If his CBC is normal he will use Bentyl 10 mg by mouth 3 times a day when necessary spasms and adhere to a high-fiber low-fat diet. If his CBC indicates a mild elevation of his white blood cell count he will start the Cipro and Flagyl. (He has been given printed prescriptions for Cipro 500 mg  By mouth twice daily for 10 days along with Flagyl 500 mg by mouth 3 times a day for 10 days). A considerable amount of time has been spent discussing options for colorectal cancer screening , i.e. Colonoscopy versus cold guard. Patient states he called to schedule colonoscopy in early November but was told there were no available spots for the rest of the year. He asks if perhaps we have a cancellation. Patient has tentatively been scheduled for a colonoscopy next week to  Screen for polyps, neoplasia, or other intraluminal pathology.The risks, benefits, and alternatives to colonoscopy with possible biopsy and possible polypectomy were discussed with the patient and they consent to proceed.  It was explained to the patient however that should he have an elevated white count suggestive of diverticulitis, we would cancel his colonoscopy for about 2  months. Further recommendations will be made pending the findings of the above.    Diar Berkel, Vita Barley PA-C 04/16/2015, 3:46 PM  CC: No ref. provider found  Addendum: Reviewed and agree with initial management. Jerene Bears, MD

## 2015-04-23 ENCOUNTER — Ambulatory Visit (AMBULATORY_SURGERY_CENTER): Payer: BLUE CROSS/BLUE SHIELD | Admitting: Internal Medicine

## 2015-04-23 ENCOUNTER — Encounter: Payer: Self-pay | Admitting: Internal Medicine

## 2015-04-23 VITALS — BP 113/78 | HR 64 | Temp 96.4°F | Resp 22 | Ht 71.0 in | Wt 184.0 lb

## 2015-04-23 DIAGNOSIS — Z1211 Encounter for screening for malignant neoplasm of colon: Secondary | ICD-10-CM | POA: Diagnosis present

## 2015-04-23 DIAGNOSIS — K5730 Diverticulosis of large intestine without perforation or abscess without bleeding: Secondary | ICD-10-CM | POA: Diagnosis not present

## 2015-04-23 DIAGNOSIS — Z8719 Personal history of other diseases of the digestive system: Secondary | ICD-10-CM

## 2015-04-23 DIAGNOSIS — K598 Other specified functional intestinal disorders: Secondary | ICD-10-CM | POA: Diagnosis not present

## 2015-04-23 MED ORDER — FLEET ENEMA 7-19 GM/118ML RE ENEM
1.0000 | ENEMA | Freq: Once | RECTAL | Status: AC
  Administered 2015-04-23: 1 via RECTAL

## 2015-04-23 MED ORDER — SODIUM CHLORIDE 0.9 % IV SOLN: 500.0000 mL | INTRAVENOUS | Status: DC

## 2015-04-23 NOTE — Op Note (Addendum)
[facility] [address] [place], [id number]   COLONOSCOPY PROCEDURE REPORT  PATIENT: Ricky Bennett, Ricky Bennett  MR#: [id number] BIRTHDATE: 12/26/1960 , 54  yrs. old GENDER: male ENDOSCOPIST: Beverley FiedlerJay M Rise Traeger, MD PROCEDURE DATE:  04/23/2015 PROCEDURE:   Colonoscopy, screening and Colonoscopy with biopsy First Screening Colonoscopy - Avg.  risk and is 50 yrs.  old or older Yes.  Prior Negative Screening - Now for repeat screening. N/A  History of Adenoma - Now for follow-up colonoscopy & has been > or = to 3 yrs.  N/A  Polyps removed today? No Recommend repeat exam, <10 yrs? No ASA CLASS:   Class II INDICATIONS:Screening for colonic neoplasia, Colorectal Neoplasm Risk Assessment for this procedure is average risk, and 1st colonoscopy, history of diverticulitis MEDICATIONS: Monitored anesthesia care and Propofol 250 mg IV  DESCRIPTION OF PROCEDURE:   After the risks benefits and alternatives of the procedure were thoroughly explained, informed consent was obtained.  The digital rectal exam revealed no rectal mass.   The LB [id number]  endoscope was introduced through the anus and advanced to the cecum, which was identified by both the appendix and ileocecal valve. No adverse events experienced. The quality of the prep was good.  (Suprep was used)  The instrument was then slowly withdrawn as the colon was fully examined. Estimated blood loss is zero unless otherwise noted in this procedure report.  COLON FINDINGS: There was severe diverticulosis noted in the sigmoid colon with associated inflammatory changes.  This inflammation was focal and scattered without ulceration.   There was mild diverticulosis noted in the transverse colon and descending colon. Retroflexed views revealed small external hemorrhoids. The time to cecum = 3.1 Withdrawal time = 15.2   The scope was withdrawn and the procedure completed.  COMPLICATIONS: There were no immediate  complications.  ENDOSCOPIC IMPRESSION: 1.   There was severe diverticulosis noted in the sigmoid colon 2.   Mild diverticulosis was noted in the transverse colon and descending colon   RECOMMENDATIONS: 1.  Await biopsy results 2.  If segmental colitis is found then would recommend trial of Lialda 3.  High fiber diet 4.  You should continue to follow colorectal cancer screening guidelines for "routine risk" patients with a repeat colonoscopy in 10 years.  There is no need for FOBT (stool) testing for at least 5 years.  eSigned:  Beverley FiedlerJay M Keven Osborn, MD 07/06/2015 1:33 PM Revised: 07/06/2015 1:33 PM  cc: the patient, PCP   PATIENT NAME:  Ricky Bennett, Ricky Bennett MR#: [id number]

## 2015-04-23 NOTE — Progress Notes (Signed)
A/ox3 pleased with MAC, report to Jill RN 

## 2015-04-23 NOTE — Patient Instructions (Signed)
YOU HAD AN ENDOSCOPIC PROCEDURE TODAY AT THE [facility]:   Refer to the procedure report that was given to you for any specific questions about what was found during the examination.  If the procedure report does not answer your questions, please call your gastroenterologist to clarify.  If you requested that your care partner not be given the details of your procedure findings, then the procedure report has been included in a sealed envelope for you to review at your convenience later.  YOU SHOULD EXPECT: Some feelings of bloating in the abdomen. Passage of more gas than usual.  Walking can help get rid of the air that was put into your GI tract during the procedure and reduce the bloating. If you had a lower endoscopy (such as a colonoscopy or flexible sigmoidoscopy) you may notice spotting of blood in your stool or on the toilet paper. If you underwent a bowel prep for your procedure, you may not have a normal bowel movement for a few days.  Please Note:  You might notice some irritation and congestion in your nose or some drainage.  This is from the oxygen used during your procedure.  There is no need for concern and it should clear up in a day or so.  SYMPTOMS TO REPORT IMMEDIATELY:   Following lower endoscopy (colonoscopy or flexible sigmoidoscopy):  Excessive amounts of blood in the stool  Significant tenderness or worsening of abdominal pains  Swelling of the abdomen that is new, acute  Fever of 100F or higher  For urgent or emergent issues, a gastroenterologist can be reached at any hour by calling [phone].   DIET: Your first meal following the procedure should be a small meal and then it is ok to progress to your normal diet. Heavy or fried foods are harder to digest and may make you feel nauseous or bloated.  Likewise, meals heavy in dairy and vegetables can increase bloating.  Drink plenty of fluids but you should avoid alcoholic beverages for 24  hours.  ACTIVITY:  You should plan to take it easy for the rest of today and you should NOT DRIVE or use heavy machinery until tomorrow (because of the sedation medicines used during the test).    FOLLOW UP: Our staff will call the number listed on your records the next business day following your procedure to check on you and address any questions or concerns that you may have regarding the information given to you following your procedure. If we do not reach you, we will leave a message.  However, if you are feeling well and you are not experiencing any problems, there is no need to return our call.  We will assume that you have returned to your regular daily activities without incident.  If any biopsies were taken you will be contacted by phone or by letter within the next 1-3 weeks.  Please call us at [phone] if you have not heard about the biopsies in 3 weeks.    SIGNATURES/CONFIDENTIALITY: You and/or your care partner have signed paperwork which will be entered into your electronic medical record.  These signatures attest to the fact that that the information above on your After Visit Summary has been reviewed and is understood.  Full responsibility of the confidentiality of this discharge information lies with you and/or your care-partner.  Await pathology results Diverticulitis/ high fber diet handout given

## 2015-04-23 NOTE — Progress Notes (Signed)
Called to room to assist during endoscopic procedure.  Patient ID and intended procedure confirmed with present staff. Received instructions for my participation in the procedure from the performing physician.  

## 2015-04-24 ENCOUNTER — Telehealth: Payer: Self-pay

## 2015-04-24 ENCOUNTER — Telehealth: Payer: Self-pay | Admitting: Internal Medicine

## 2015-04-24 NOTE — Telephone Encounter (Signed)
Patient was diagnosed with severe diverticulosis and a possible segmental biopsy.  All questions answered.  He is advised we will contact him with the results by letter or phone call once they are back.

## 2015-04-24 NOTE — Telephone Encounter (Signed)
Left message for patient to call back  

## 2015-04-24 NOTE — Telephone Encounter (Signed)
Left message on answering machine. 

## 2015-04-30 ENCOUNTER — Encounter: Payer: Self-pay | Admitting: Internal Medicine

## 2015-06-08 ENCOUNTER — Ambulatory Visit (INDEPENDENT_AMBULATORY_CARE_PROVIDER_SITE_OTHER): Payer: BLUE CROSS/BLUE SHIELD | Admitting: Cardiology

## 2015-06-08 ENCOUNTER — Encounter: Payer: Self-pay | Admitting: Cardiology

## 2015-06-08 VITALS — BP 102/74 | HR 62 | Ht 72.0 in | Wt 186.1 lb

## 2015-06-08 DIAGNOSIS — E781 Pure hyperglyceridemia: Secondary | ICD-10-CM

## 2015-06-08 DIAGNOSIS — I471 Supraventricular tachycardia: Secondary | ICD-10-CM | POA: Diagnosis not present

## 2015-06-08 DIAGNOSIS — I10 Essential (primary) hypertension: Secondary | ICD-10-CM | POA: Diagnosis not present

## 2015-06-08 LAB — BASIC METABOLIC PANEL
BUN: 12 mg/dL (ref 7–25)
CO2: 28 mmol/L (ref 20–31)
Calcium: 9.3 mg/dL (ref 8.6–10.3)
Chloride: 103 mmol/L (ref 98–110)
Creat: 0.78 mg/dL (ref 0.70–1.33)
GLUCOSE: 100 mg/dL — AB (ref 65–99)
POTASSIUM: 4.3 mmol/L (ref 3.5–5.3)
Sodium: 139 mmol/L (ref 135–146)

## 2015-06-08 LAB — HEPATIC FUNCTION PANEL
ALBUMIN: 4.1 g/dL (ref 3.6–5.1)
ALT: 34 U/L (ref 9–46)
AST: 22 U/L (ref 10–35)
Alkaline Phosphatase: 56 U/L (ref 40–115)
BILIRUBIN DIRECT: 0.1 mg/dL (ref <=0.2)
Indirect Bilirubin: 0.6 mg/dL (ref 0.2–1.2)
TOTAL PROTEIN: 6.7 g/dL (ref 6.1–8.1)
Total Bilirubin: 0.7 mg/dL (ref 0.2–1.2)

## 2015-06-08 LAB — CBC WITH DIFFERENTIAL/PLATELET
BASOS ABS: 0.1 10*3/uL (ref 0.0–0.1)
Basophils Relative: 1 % (ref 0–1)
EOS ABS: 0.1 10*3/uL (ref 0.0–0.7)
EOS PCT: 2 % (ref 0–5)
HEMATOCRIT: 43.1 % (ref 39.0–52.0)
Hemoglobin: 14.5 g/dL (ref 13.0–17.0)
LYMPHS ABS: 1.6 10*3/uL (ref 0.7–4.0)
LYMPHS PCT: 29 % (ref 12–46)
MCH: 31.5 pg (ref 26.0–34.0)
MCHC: 33.6 g/dL (ref 30.0–36.0)
MCV: 93.5 fL (ref 78.0–100.0)
MONO ABS: 0.3 10*3/uL (ref 0.1–1.0)
MONOS PCT: 6 % (ref 3–12)
MPV: 10.2 fL (ref 8.6–12.4)
Neutro Abs: 3.4 10*3/uL (ref 1.7–7.7)
Neutrophils Relative %: 62 % (ref 43–77)
Platelets: 184 10*3/uL (ref 150–400)
RBC: 4.61 MIL/uL (ref 4.22–5.81)
RDW: 13.3 % (ref 11.5–15.5)
WBC: 5.5 10*3/uL (ref 4.0–10.5)

## 2015-06-08 LAB — LIPID PANEL
CHOL/HDL RATIO: 4.6 ratio (ref <=5.0)
CHOLESTEROL: 174 mg/dL (ref 125–200)
HDL: 38 mg/dL — AB (ref >=40)
LDL Cholesterol: 108 mg/dL (ref <130)
Triglycerides: 140 mg/dL (ref <150)
VLDL: 28 mg/dL (ref <30)

## 2015-06-08 MED ORDER — AMLODIPINE BESYLATE 5 MG PO TABS: 2.5000 mg | ORAL_TABLET | Freq: Every day | ORAL | Status: DC

## 2015-06-08 NOTE — Progress Notes (Signed)
Ricky Bennett Date of Birth: [birth year]   History of Present Illness: [name] is seen for followup of his hypertension. He also has a history of nonsustained PSVT.  He continues to exercise regularly with rollerskating. He continue to take a lot of health food supplements. No chest pain or SOB. He has occ. Mild flutter sensation and some lightheadedness.   Current Outpatient Prescriptions on File Prior to Visit  Medication Sig Dispense Refill  . Acai 500 MG CAPS Take 1,000 mg by mouth.     Marland Kitchen ALFALFA PO Take by mouth as directed.    . Arginine 500 MG CAPS Take by mouth. 400 mg    . Ascorbic Acid (VITAMIN C) 100 MG tablet Take 100 mg by mouth daily.    Marland Kitchen BIOTIN PO Take by mouth.    . cholecalciferol (VITAMIN D) 1000 UNITS tablet Take 1,000 Units by mouth daily.    . Cinnamon 500 MG TABS Take by mouth.      Marland Kitchen Cod Liver Oil 1250-130 UNITS CAPS Take by mouth.      . Coenzyme Q10 (CO Q-10) 200 MG CAPS Take by mouth.      . Cranberry 250 MG CAPS Take by mouth.      . Cyanocobalamin (B-12) 500 MCG TABS Take 1,000 mcg by mouth.     . dicyclomine (BENTYL) 10 MG capsule Take 1 capsule (10 mg total) by mouth 3 (three) times daily as needed for spasms. 90 capsule 1  . EPINEPHrine 0.3 mg/0.3 mL IJ SOAJ injection Inject 0.3 mLs (0.3 mg total) into the muscle once. 2 Device 1  . Flaxseed, Linseed, (FLAX SEED OIL PO) Take 2,400 mg by mouth.      . folic acid (FOLVITE) 800 MCG tablet Take 400 mcg by mouth daily.     . Garlic 2000 MG CAPS Take 75 mg by mouth.     . Glucosamine-Chondroitin-Vit C 2000-1200-60 MG/30ML LIQD Take by mouth.      . Grape Seed 50 MG TABS Take by mouth.      Asencion Gowda 550 MG CAPS Take by mouth.      . irbesartan (AVAPRO) 300 MG tablet Take 1 tablet (300 mg total) by mouth daily. 30 tablet 5  . Magnesium 250 MG TABS Take 500 mg by mouth.     . Niacin-Inositol 400-100 MG CAPS Take by mouth.      . Omega-3 Fatty Acids (FISH OIL) 1000 MG CPDR Take by mouth.    .  Phytosterol Esters (PHYTOSTEROL COMPLEX) 500 MG CAPS Take by mouth.      . pyridOXINE (VITAMIN B-6) 100 MG tablet Take 100 mg by mouth daily.      . Saw Palmetto, Serenoa repens, 450 MG CAPS Take 900 mg by mouth 2 (two) times daily.     . vitamin C (ASCORBIC ACID) 500 MG tablet Take 500 mg by mouth daily.      Marland Kitchen VITAMIN E BLEND PO Take by mouth. Reported on 04/23/2015     No current facility-administered medications on file prior to visit.    Allergies  Allergen Reactions  . Bee Venom     Past Medical History  Diagnosis Date  . HTN (hypertension)   . Prostatitis   . Hemorrhoids   . Hypertriglyceridemia     Past Surgical History  Procedure Laterality Date  . Hemorrhoid surgery  09/21/10    hemorrhoidal banding x2; Rt anterior hemorrhoidectomy  . US echocardiography  07/09/2010  . Skin  cancer excision      back    History  Smoking status  . Never Smoker   Smokeless tobacco  . Never Used    History  Alcohol Use No    Family History  Problem Relation Age of Onset  . Heart attack Father   . Hypertension Father   . Stroke Father   . Hypertension Brother   . Stroke Brother   . Coronary artery disease Brother   . Stroke Sister   . Colon cancer Neg Hx     Review of Systems: As noted in history of present illness.  All other systems were reviewed and are negative.  Physical Exam: BP 102/74 mmHg  Pulse 62  Ht 6' (1.829 m)  Wt 84.426 kg (186 lb 2 oz)  BMI 25.24 kg/m2 He is a middle-aged white male in no acute distress. His HEENT exam is unremarkable. He has no JVD or bruits. There is no adenopathy. Lungs are clear. Cardiac exam reveals a regular rate and rhythm without gallop, murmur, or click. Abdomen is soft and nontender without masses or bruits. Femoral and pedal pulses are 2+ and symmetric. He is alert and oriented x3. Cranial nerves II through XII are intact.  LABORATORY DATA: Lab Results  Component Value Date   WBC 7.6 04/16/2015   HGB 14.8 04/16/2015    HCT 44.2 04/16/2015   PLT 214.0 04/16/2015   GLUCOSE 119* 06/10/2014   CHOL 178 07/25/2013   TRIG 122.0 07/25/2013   HDL 41.40 07/25/2013   LDLDIRECT 121.8 06/01/2011   LDLCALC 112* 07/25/2013   ALT 43 06/10/2014   AST 27 06/10/2014   NA 138 06/10/2014   K 4.4 06/10/2014   CL 102 06/10/2014   CREATININE 0.75 06/10/2014   BUN 11 06/10/2014   CO2 28 06/10/2014   TSH 1.955 06/10/2014   INR 1.1* 10/20/2011   Ecg today shows NSR with normal Ecg. I have personally reviewed and interpreted this study.   Assessment / Plan: 1. Hypertension. Blood pressures under excellent control. With his lightheadedness will reduce amlodipine to 2.5 mg daily.  2. Hypertriglyceridemia. Will check fasting lab work today.  3. PSVT. Minimal symptoms. I have recommended avoiding caffeine. No sustained symptoms.  Follow up in one year.

## 2015-06-08 NOTE — Patient Instructions (Signed)
Reduce amlodipine to 2.5 mg daily  Continue your other therapy  We will schedule you for fasting lab work  Follow up in one year

## 2015-06-30 ENCOUNTER — Telehealth: Payer: Self-pay | Admitting: Internal Medicine

## 2015-06-30 NOTE — Telephone Encounter (Signed)
Pt states that procedure was coded incorrect - it states that he had polyps and is being coded as diagnostic now.  He did not have polyps removed and would like this addressed please.  Call back # [phone]

## 2015-06-30 NOTE — Telephone Encounter (Signed)
Patient called and would not stop yelling and cussing.  Asked how I could help, but patient would not stop yelling and belittling me over the phone.  Advised pt that I would end call if he did not stop. Pt stated he was going to report me to my manager.  I ended call.

## 2015-07-02 NOTE — Telephone Encounter (Signed)
I attempted to contact patient again.  I was not able to speak with him.  I did leave a message that I would be out of the office until Monday.  I will attempt to call him on Monday 07/06/15.

## 2015-07-02 NOTE — Telephone Encounter (Signed)
Patient contacted to discuss his concerns with coding and billing.  Patient stated " I am about to go into an appt.   Can I call you back?".  Patient provided my contact numbers.  I did advise him that I have meetings myself this afternoon and I am not available until after 3 today and that I will not be in the office tomorrow.  He is advised that if he calls back and misses me I will contact him next week when I return to the office.

## 2015-07-02 NOTE — Telephone Encounter (Signed)
Patient returned call approximately 2:10 this afternoon.  I was not available to speak with him.  He left a message to return his call on [phone].  I left a message to call back on the listed number.

## 2015-07-06 NOTE — Telephone Encounter (Signed)
I had a long discussion with patient about how his procedure was billed/coded.  He feels that his procedure should be billed as a screening because he had no polyps removed.  I explained that he did have a colonoscopy to screen for polyps, but he also had a history of LLQ pain and recent diverticulitis.  I explained that a screening colonoscopy or "preventative" is for a healthy patient, age 55,  with no symptoms, prior history of polyps, family history of polyps or colon cancer.  I explained that the fact he had symptoms when he came to us made his procedure a diagnostic procedure.  I also explained that his colon was visually abnormal to Dr. Rhea BeltonPyrtle.  Inflammatory changes were noted at the time of the procedure and he biopsied the area.  He verbalized understanding of the explanation.  He asked if I would have a coder to look at it one more time and make sure that there is not a billing charge that could be entered that would benefit him.  He relayed he pays $800 a month for his insurance and another $400 bill  was a hardship when he thought it was going to be covered at 100%.  I apologized for the misunderstanding.  I advised him I will have our coder review it again and make sure there is nothing more we can do to help on our end. He thanked me for my help.  He understands I will contact him once I hear back from our coder.

## 2015-07-06 NOTE — Telephone Encounter (Signed)
Ina KickBarbara Lewis reviewed and indicated if the LLQ pain that had resolved was removed as an indication we could change the coding.  It may change the reimbursement for the patient.  Dr. Rhea BeltonPyrtle amended the procedure report.   Left message for patient to call back to discuss further.

## 2015-07-07 ENCOUNTER — Telehealth: Payer: Self-pay | Admitting: Cardiology

## 2015-07-07 NOTE — Telephone Encounter (Signed)
New message    Patient calling need to speak - will go into further details when she calls

## 2015-07-07 NOTE — Telephone Encounter (Signed)
Follow up ° ° °Pt returning rn call °

## 2015-07-07 NOTE — Telephone Encounter (Signed)
Returned call to patient. He is getting new health insurance(?) and wanted to know what Dr. [place]Jordan is treating him for. Provided info per last MD OV 06/08/15 - HTN, elevated triglycerides, PSVT.

## 2015-07-07 NOTE — Telephone Encounter (Signed)
Patient has been notified that the LLQ pain has been removed from his report and that they will resubmit the bill.  I did explain that there is no guarantee that his insurance will cover it at 100 % because of the biopsies.  I did explain that it will be up to his insurance company from here how they will cover it.  He verbalized understanding. Ricky Bennett.  He thanked me for my help

## 2015-07-07 NOTE — Telephone Encounter (Signed)
LMTCB

## 2015-07-15 ENCOUNTER — Telehealth: Payer: Self-pay | Admitting: Cardiology

## 2015-07-15 NOTE — Telephone Encounter (Signed)
Please call,Question about BMIT rating.

## 2015-07-15 NOTE — Telephone Encounter (Signed)
Returned call to patient no answer.LMTC. 

## 2015-07-16 NOTE — Telephone Encounter (Signed)
Received call from patient.He was calling to ask about his BMI.Stated he is applying for new health insurance.Advised at 06/08/15 office visit with Dr.Jordan his BMI was 25.24.

## 2015-10-15 ENCOUNTER — Other Ambulatory Visit: Payer: Self-pay | Admitting: Cardiology

## 2015-10-15 NOTE — Telephone Encounter (Signed)
Rx(s) sent to pharmacy electronically.  

## 2016-02-13 ENCOUNTER — Other Ambulatory Visit: Payer: Self-pay | Admitting: Cardiology

## 2016-04-01 ENCOUNTER — Telehealth: Payer: Self-pay | Admitting: Cardiology

## 2016-04-01 MED ORDER — AMLODIPINE BESYLATE 5 MG PO TABS: 5.0000 mg | ORAL_TABLET | Freq: Every day | ORAL | 3 refills | Status: DC

## 2016-04-01 NOTE — Telephone Encounter (Signed)
Pt calling requesting a new Rx for Amlodipine 5 mg tablet, taken 1/2 tablet daily. Pt stated that he has been taking a 1 tablet daily because of his B/P increasing and now the pharmacy will not refill and pt is out of his medication. Pt would like a call back as soon as possible. Please advise

## 2016-04-01 NOTE — Telephone Encounter (Signed)
Spoke with pt, he has been taking the 5 mg of the amlodipine and his bp is running fine. New script sent to the pharmacy for 5 mg tablets.

## 2016-05-16 ENCOUNTER — Telehealth: Payer: Self-pay | Admitting: Cardiology

## 2016-05-16 NOTE — Telephone Encounter (Signed)
Returned call to patient.He stated for the past 2 weeks when he checks B/P at drug store it has been averaging 138/90 pulse in 70's.Stated he has been having ringing in both ears,dizziness.Appointment offered with a extender, but he wanted to ask Dr.Jordan if medications need to be increased.Stated he has appointment with Dr.Jordan 06/14/16 did not want to pay for 2 appointments.Message sent to Dr.Jordan for advice.

## 2016-05-16 NOTE — Telephone Encounter (Signed)
New messge       Talk to the nurse.  Pt states that his bp is high but do not have any readings.  Please call

## 2016-05-16 NOTE — Telephone Encounter (Signed)
Returned call to patient no answer.LMTC. 

## 2016-05-16 NOTE — Telephone Encounter (Signed)
It looks like on his last visit we reduced his amlodipine to 2.5 mg daily. He can take it twice a day to minimize lightheadedness  Ricky Burbach SwazilandJordan MD, [facility]

## 2016-05-20 NOTE — Telephone Encounter (Signed)
Spoke to patient Dr.Jordan's recommendations given.He stated he never decreased amlodipine.He is taking 5 mg daily.Advised I will speak to Dr.Jordan on Mon 1/29 and call you back.

## 2016-05-23 NOTE — Telephone Encounter (Signed)
Returned call to patient Dr.Jordan advised to increase amlodipine to 10 mg daily.Advised to call back if B/P continues to be elevated.

## 2016-06-12 NOTE — Progress Notes (Deleted)
Ricky Bennett Date of Birth: [birth year]   History of Present Illness: [name] is seen for followup of his hypertension. He also has a history of nonsustained PSVT. On his last visit we reduced his amlodipine dose because of lightheadedness. He called in December noting his BP was running higher and it was increased back to 5 mg daily.    He continues to exercise regularly with rollerskating. He continue to take a lot of health food supplements. No chest pain or SOB. He has occ. Mild flutter sensation and some lightheadedness.   Current Outpatient Prescriptions on File Prior to Visit  Medication Sig Dispense Refill  . Acai 500 MG CAPS Take 1,000 mg by mouth.     Marland Kitchen ALFALFA PO Take by mouth as directed.    Marland Kitchen amLODipine (NORVASC) 10 MG tablet Take 1 tablet (10 mg total) by mouth daily. 180 tablet 3  . Arginine 500 MG CAPS Take by mouth. 400 mg    . Ascorbic Acid (VITAMIN C) 100 MG tablet Take 100 mg by mouth daily.    Marland Kitchen BIOTIN PO Take by mouth.    . cholecalciferol (VITAMIN D) 1000 UNITS tablet Take 1,000 Units by mouth daily.    . Cinnamon 500 MG TABS Take by mouth.      Marland Kitchen Cod Liver Oil 1250-130 UNITS CAPS Take by mouth.      . Coenzyme Q10 (CO Q-10) 200 MG CAPS Take by mouth.      . Cranberry 250 MG CAPS Take by mouth.      . Cyanocobalamin (B-12) 500 MCG TABS Take 1,000 mcg by mouth.     . dicyclomine (BENTYL) 10 MG capsule Take 1 capsule (10 mg total) by mouth 3 (three) times daily as needed for spasms. 90 capsule 1  . EPINEPHrine 0.3 mg/0.3 mL IJ SOAJ injection Inject 0.3 mLs (0.3 mg total) into the muscle once. 2 Device 1  . Flaxseed, Linseed, (FLAX SEED OIL PO) Take 2,400 mg by mouth.      . folic acid (FOLVITE) 800 MCG tablet Take 400 mcg by mouth daily.     . Garlic 2000 MG CAPS Take 75 mg by mouth.     . Glucosamine-Chondroitin-Vit C 2000-1200-60 MG/30ML LIQD Take by mouth.      . Grape Seed 50 MG TABS Take by mouth.      Asencion Gowda 550 MG CAPS Take by mouth.      .  irbesartan (AVAPRO) 300 MG tablet TAKE 1 TABLET (300 MG TOTAL) BY MOUTH DAILY. 30 tablet 7  . Magnesium 250 MG TABS Take 500 mg by mouth.     . Niacin-Inositol 400-100 MG CAPS Take by mouth.      . Omega-3 Fatty Acids (FISH OIL) 1000 MG CPDR Take by mouth.    . Phytosterol Esters (PHYTOSTEROL COMPLEX) 500 MG CAPS Take by mouth.      . pyridOXINE (VITAMIN B-6) 100 MG tablet Take 100 mg by mouth daily.      . Saw Palmetto, Serenoa repens, 450 MG CAPS Take 900 mg by mouth 2 (two) times daily.     . vitamin C (ASCORBIC ACID) 500 MG tablet Take 500 mg by mouth daily.      Marland Kitchen VITAMIN E BLEND PO Take by mouth. Reported on 04/23/2015     No current facility-administered medications on file prior to visit.     Allergies  Allergen Reactions  . Bee Venom     Past Medical History:  Diagnosis Date  . Hemorrhoids   . HTN (hypertension)   . Hypertriglyceridemia   . Prostatitis     Past Surgical History:  Procedure Laterality Date  . HEMORRHOID SURGERY  09/21/10   hemorrhoidal banding x2; Rt anterior hemorrhoidectomy  . SKIN CANCER EXCISION     back  . US ECHOCARDIOGRAPHY  07/09/2010    History  Smoking Status  . Never Smoker  Smokeless Tobacco  . Never Used    History  Alcohol Use No    Family History  Problem Relation Age of Onset  . Heart attack Father   . Hypertension Father   . Stroke Father   . Hypertension Brother   . Stroke Brother   . Coronary artery disease Brother   . Stroke Sister   . Colon cancer Neg Hx     Review of Systems: As noted in history of present illness.  All other systems were reviewed and are negative.  Physical Exam: There were no vitals taken for this visit. He is a middle-aged white male in no acute distress. His HEENT exam is unremarkable. He has no JVD or bruits. There is no adenopathy. Lungs are clear. Cardiac exam reveals a regular rate and rhythm without gallop, murmur, or click. Abdomen is soft and nontender without masses or bruits.  Femoral and pedal pulses are 2+ and symmetric. He is alert and oriented x3. Cranial nerves II through XII are intact.  LABORATORY DATA: Lab Results  Component Value Date   WBC 5.5 06/08/2015   HGB 14.5 06/08/2015   HCT 43.1 06/08/2015   PLT 184 06/08/2015   GLUCOSE 100 (H) 06/08/2015   CHOL 174 06/08/2015   TRIG 140 06/08/2015   HDL 38 (L) 06/08/2015   LDLDIRECT 121.8 06/01/2011   LDLCALC 108 06/08/2015   ALT 34 06/08/2015   AST 22 06/08/2015   NA 139 06/08/2015   K 4.3 06/08/2015   CL 103 06/08/2015   CREATININE 0.78 06/08/2015   BUN 12 06/08/2015   CO2 28 06/08/2015   TSH 1.955 06/10/2014   INR 1.1 (H) 10/20/2011   Ecg today shows NSR with normal Ecg. I have personally reviewed and interpreted this study.   Assessment / Plan: 1. Hypertension. Blood pressures under excellent control. With his lightheadedness will reduce amlodipine to 2.5 mg daily.  2. Hypertriglyceridemia. Will check fasting lab work today.  3. PSVT. Minimal symptoms. I have recommended avoiding caffeine. No sustained symptoms.  Follow up in one year.

## 2016-06-13 ENCOUNTER — Telehealth: Payer: Self-pay | Admitting: Cardiology

## 2016-06-13 MED ORDER — IRBESARTAN 300 MG PO TABS: ORAL_TABLET | ORAL | 0 refills | Status: DC

## 2016-06-13 NOTE — Telephone Encounter (Addendum)
New message       Talk to the nurse.  Pt cannot keep his upcoming appt and want to be "worked in" with Dr [place]Jordan only  Soon.  I offered the next available appt, but he has a strict schedule and can only come at a certain day/time.  Please call.  I did not cancel his appt in case he changes his mind with the nurse and decide to keep his appt

## 2016-06-13 NOTE — Telephone Encounter (Signed)
Returned call to patient he stated he cannot keep appointment with Dr.Jordan tomorrow 06/14/16 needs to reschedule to a different day.Advised our scheduler will call back to reschedule appointment.

## 2016-06-14 ENCOUNTER — Ambulatory Visit: Payer: BLUE CROSS/BLUE SHIELD | Admitting: Cardiology

## 2016-06-14 NOTE — Progress Notes (Signed)
Ricky Bennett Date of Birth: [birth year]   History of Present Illness: Ricky Bennett is seen for followup of his hypertension. He also has a history of nonsustained PSVT. When seen last year he complained of symptoms of lightheadedness. His amlodipine was reduced to 2.5 mg. More recently he noted his BP was elevated and his amlodipine was increased back to 5 mg daily.  He continues to exercise regularly with rollerskating. He likes to ski. He continue to take a lot of health food supplements. No chest pain or SOB. He has occ. rare flutter sensation and some lightheadedness.   Current Outpatient Prescriptions on File Prior to Visit  Medication Sig Dispense Refill  . Acai 500 MG CAPS Take 1,000 mg by mouth.     Marland Kitchen. ALFALFA PO Take by mouth as directed.    . Arginine 500 MG CAPS Take by mouth. 400 mg    . Ascorbic Acid (VITAMIN C) 100 MG tablet Take 100 mg by mouth daily.    Marland Kitchen. BIOTIN PO Take by mouth.    . cholecalciferol (VITAMIN D) 1000 UNITS tablet Take 1,000 Units by mouth daily.    . Cinnamon 500 MG TABS Take by mouth.      Marland Kitchen. Cod Liver Oil 1250-130 UNITS CAPS Take by mouth.      . Coenzyme Q10 (CO Q-10) 200 MG CAPS Take by mouth.      . Cranberry 250 MG CAPS Take by mouth.      . Cyanocobalamin (B-12) 500 MCG TABS Take 1,000 mcg by mouth.     . dicyclomine (BENTYL) 10 MG capsule Take 1 capsule (10 mg total) by mouth 3 (three) times daily as needed for spasms. 90 capsule 1  . EPINEPHrine 0.3 mg/0.3 mL IJ SOAJ injection Inject 0.3 mLs (0.3 mg total) into the muscle once. 2 Device 1  . Flaxseed, Linseed, (FLAX SEED OIL PO) Take 2,400 mg by mouth.      . folic acid (FOLVITE) 800 MCG tablet Take 400 mcg by mouth daily.     . Garlic 2000 MG CAPS Take 75 mg by mouth.     . Glucosamine-Chondroitin-Vit C 2000-1200-60 MG/30ML LIQD Take by mouth.      . Grape Seed 50 MG TABS Take by mouth.      Ricky Bennett. Hawthorne Berry 550 MG CAPS Take by mouth.      . irbesartan (AVAPRO) 300 MG tablet TAKE 1 TABLET (300  MG TOTAL) BY MOUTH DAILY. 30 tablet 0  . Magnesium 250 MG TABS Take 500 mg by mouth.     . Niacin-Inositol 400-100 MG CAPS Take by mouth.      . Omega-3 Fatty Acids (FISH OIL) 1000 MG CPDR Take by mouth.    . Phytosterol Esters (PHYTOSTEROL COMPLEX) 500 MG CAPS Take by mouth.      . pyridOXINE (VITAMIN B-6) 100 MG tablet Take 100 mg by mouth daily.      . Saw Palmetto, Serenoa repens, 450 MG CAPS Take 900 mg by mouth 2 (two) times daily.     . vitamin C (ASCORBIC ACID) 500 MG tablet Take 500 mg by mouth daily.      Marland Kitchen. VITAMIN E BLEND PO Take by mouth. Reported on 04/23/2015     No current facility-administered medications on file prior to visit.     Allergies  Allergen Reactions  . Bee Venom     Past Medical History:  Diagnosis Date  . Hemorrhoids   . HTN (hypertension)   .  Hypertriglyceridemia   . Prostatitis     Past Surgical History:  Procedure Laterality Date  . HEMORRHOID SURGERY  09/21/10   hemorrhoidal banding x2; Rt anterior hemorrhoidectomy  . SKIN CANCER EXCISION     back  . US ECHOCARDIOGRAPHY  07/09/2010    History  Smoking Status  . Never Smoker  Smokeless Tobacco  . Never Used    History  Alcohol Use No    Family History  Problem Relation Age of Onset  . Heart attack Father   . Hypertension Father   . Stroke Father   . Hypertension Brother   . Stroke Brother   . Coronary artery disease Brother   . Stroke Sister   . Colon cancer Neg Hx     Review of Systems: As noted in history of present illness.  All other systems were reviewed and are negative.  Physical Exam: BP 118/80   Pulse 70   Ht 6' (1.829 m)   Wt 194 lb (88 kg)   BMI 26.31 kg/m  He is a middle-aged white male in no acute distress. His HEENT exam is unremarkable. He has no JVD or bruits. There is no adenopathy. Lungs are clear. Cardiac exam reveals a regular rate and rhythm without gallop, murmur, or click. Abdomen is soft and nontender without masses or bruits. Femoral and pedal  pulses are 2+ and symmetric. He is alert and oriented x3. Cranial nerves II through XII are intact.  LABORATORY DATA: Lab Results  Component Value Date   WBC 5.5 06/08/2015   HGB 14.5 06/08/2015   HCT 43.1 06/08/2015   PLT 184 06/08/2015   GLUCOSE 100 (H) 06/08/2015   CHOL 174 06/08/2015   TRIG 140 06/08/2015   HDL 38 (L) 06/08/2015   LDLDIRECT 121.8 06/01/2011   LDLCALC 108 06/08/2015   ALT 34 06/08/2015   AST 22 06/08/2015   NA 139 06/08/2015   K 4.3 06/08/2015   CL 103 06/08/2015   CREATININE 0.78 06/08/2015   BUN 12 06/08/2015   CO2 28 06/08/2015   TSH 1.955 06/10/2014   INR 1.1 (H) 10/20/2011   Ecg today shows NSR with normal Ecg. I have personally reviewed and interpreted this study.   Assessment / Plan: 1. Hypertension. Blood pressures under excellent control. Continue amlodipine 5 mg daily.  2. Hypertriglyceridemia. Improved.   3. PSVT. Minimal symptoms. I have recommended avoiding caffeine. No sustained symptoms.  Follow up in one year.

## 2016-06-15 ENCOUNTER — Ambulatory Visit (INDEPENDENT_AMBULATORY_CARE_PROVIDER_SITE_OTHER): Payer: Self-pay | Admitting: Cardiology

## 2016-06-15 ENCOUNTER — Encounter: Payer: Self-pay | Admitting: Cardiology

## 2016-06-15 VITALS — BP 118/80 | HR 70 | Ht 72.0 in | Wt 194.0 lb

## 2016-06-15 DIAGNOSIS — I471 Supraventricular tachycardia: Secondary | ICD-10-CM

## 2016-06-15 DIAGNOSIS — I10 Essential (primary) hypertension: Secondary | ICD-10-CM

## 2016-06-15 MED ORDER — AMLODIPINE BESYLATE 5 MG PO TABS: 5.0000 mg | ORAL_TABLET | Freq: Every day | ORAL | 3 refills | Status: DC

## 2016-06-15 NOTE — Patient Instructions (Addendum)
Continue your current therapy  I will see you in one year   

## 2016-06-29 ENCOUNTER — Other Ambulatory Visit: Payer: Self-pay | Admitting: Cardiology

## 2016-07-14 ENCOUNTER — Telehealth: Payer: Self-pay | Admitting: Cardiology

## 2016-07-14 DIAGNOSIS — I499 Cardiac arrhythmia, unspecified: Secondary | ICD-10-CM

## 2016-07-14 DIAGNOSIS — I498 Other specified cardiac arrhythmias: Secondary | ICD-10-CM

## 2016-07-14 NOTE — Telephone Encounter (Signed)
Spoke with pt states that he is having an odd feeling in his chest. Pt states that he does not have BP monitor. Pt states that this feeling is almost a fluttering or a skipping a bet.this feeling that happens intermittently, he states this does not happen with exertion but mostly when he is at rest, he states it only happens for a few seconds but happens about 20 times a day. He states that he was only dizzy once. He states that he has no other symptoms to relate to this or any other reason this could be happening.  Pt states that he takes his amlodipine and irbesartan at HS along with all the supplements .   He is also having a Hard Heart beat in the evening hours when he is at rest in the bed before taking his medications. It happens for several hours before going to sleep, while he is watching TV. He does not know if his medications help because he takes them and falls asleep. Pt denies any other symptoms, nausea, vomiting, chest pain or pressure, shortness of breath,  Or no lightheaded feeling. Pt informed to go to the ER if symptoms worsen, or any new symptoms develop verbalizes understanding. Scheduled next available PAOV with [place] 07-26-16 @130pm , ok to wait?

## 2016-07-14 NOTE — Telephone Encounter (Signed)
Pt notified he will await scheduleing phone call and appt

## 2016-07-14 NOTE — Telephone Encounter (Signed)
Left detailed message--ok per DPR. 

## 2016-07-14 NOTE — Telephone Encounter (Signed)
New Message  Pt voiced wanting to be seen by MD and not APP/NP/PA. Offered 4.2.18 and pt voiced he needs to be seen sooner.  Pt voiced it feels like his heart is skipping a beat or a hard heart beat.  Please f/u

## 2016-07-14 NOTE — Telephone Encounter (Signed)
Keep appointment with [place] but go ahead and place a 24 hour Holter monitor so we can see if he is having an arrhythmia.  Jaliza Seifried [place] MD, [facility] Center[facility]

## 2016-07-14 NOTE — Telephone Encounter (Signed)
Follow up   Pt is returning call to nurse about his appt.

## 2016-07-15 NOTE — Telephone Encounter (Signed)
Spoke to patient he stated he has 24 hr holter monitor scheduled 07/26/16 needs follow up appointment scheduled with Dr.Jordan.Appointment scheduled with Dr.Jordan 08/08/16 at 11:40 am.

## 2016-07-25 ENCOUNTER — Encounter: Payer: Self-pay | Admitting: Cardiology

## 2016-07-26 ENCOUNTER — Other Ambulatory Visit: Payer: Self-pay | Admitting: Cardiology

## 2016-07-26 ENCOUNTER — Ambulatory Visit (INDEPENDENT_AMBULATORY_CARE_PROVIDER_SITE_OTHER): Payer: Self-pay

## 2016-07-26 ENCOUNTER — Ambulatory Visit: Payer: Self-pay | Admitting: Student

## 2016-07-26 DIAGNOSIS — I471 Supraventricular tachycardia: Secondary | ICD-10-CM

## 2016-07-26 DIAGNOSIS — I498 Other specified cardiac arrhythmias: Secondary | ICD-10-CM

## 2016-07-26 DIAGNOSIS — R42 Dizziness and giddiness: Secondary | ICD-10-CM

## 2016-07-26 DIAGNOSIS — I499 Cardiac arrhythmia, unspecified: Secondary | ICD-10-CM

## 2016-07-27 ENCOUNTER — Telehealth: Payer: Self-pay | Admitting: Cardiology

## 2016-08-02 ENCOUNTER — Encounter: Payer: Self-pay | Admitting: Cardiology

## 2016-08-04 ENCOUNTER — Telehealth: Payer: Self-pay | Admitting: Cardiology

## 2016-08-04 NOTE — Telephone Encounter (Signed)
Close encounter 

## 2016-08-05 NOTE — Progress Notes (Signed)
Ricky Bennett Date of Birth: June 28, 1960   History of Present Illness: Ricky Bennett is seen for followup palpitations. He also has a history of nonsustained PSVT.  After his last visit he complained of intermittent fluttering, palpitations, and " hard heart beat". On only one occasion he felt slightly lightheaded.  A Holter monitor was placed and showed rare PACs and PVCs. His diary did not correlate symptoms with ectopy.   He continues to exercise regularly with rollerskating.  He continue to take a lot of health food supplements but has not changed recently. No chest pain or SOB.   Current Outpatient Prescriptions on File Prior to Visit  Medication Sig Dispense Refill  . Acai 500 MG CAPS Take 1,000 mg by mouth.     Marland Kitchen ALFALFA PO Take by mouth as directed.    Marland Kitchen amLODipine (NORVASC) 5 MG tablet Take 1 tablet (5 mg total) by mouth daily. 180 tablet 3  . Arginine 500 MG CAPS Take by mouth. 400 mg    . Ascorbic Acid (VITAMIN C) 100 MG tablet Take 100 mg by mouth daily.    Marland Kitchen BIOTIN PO Take by mouth.    . cholecalciferol (VITAMIN D) 1000 UNITS tablet Take 1,000 Units by mouth daily.    . Cinnamon 500 MG TABS Take by mouth.      Marland Kitchen Cod Liver Oil 1250-130 UNITS CAPS Take by mouth.      . Coenzyme Q10 (CO Q-10) 200 MG CAPS Take by mouth.      . Cranberry 250 MG CAPS Take by mouth.      . Cyanocobalamin (B-12) 500 MCG TABS Take 1,000 mcg by mouth.     . dicyclomine (BENTYL) 10 MG capsule Take 1 capsule (10 mg total) by mouth 3 (three) times daily as needed for spasms. 90 capsule 1  . EPINEPHrine 0.3 mg/0.3 mL IJ SOAJ injection Inject 0.3 mLs (0.3 mg total) into the muscle once. 2 Device 1  . Flaxseed, Linseed, (FLAX SEED OIL PO) Take 2,400 mg by mouth.      . folic acid (FOLVITE) 800 MCG tablet Take 400 mcg by mouth daily.     . Garlic 2000 MG CAPS Take 75 mg by mouth.     . Glucosamine-Chondroitin-Vit C 2000-1200-60 MG/30ML LIQD Take by mouth.      . Grape Seed 50 MG TABS Take by mouth.      Asencion Gowda 550 MG CAPS Take by mouth.      . irbesartan (AVAPRO) 300 MG tablet TAKE ONE TABLET BY MOUTH DAILY 30 tablet 11  . Magnesium 250 MG TABS Take 500 mg by mouth.     . Niacin-Inositol 400-100 MG CAPS Take by mouth.      . Omega-3 Fatty Acids (FISH OIL) 1000 MG CPDR Take by mouth.    . Phytosterol Esters (PHYTOSTEROL COMPLEX) 500 MG CAPS Take by mouth.      . pyridOXINE (VITAMIN B-6) 100 MG tablet Take 100 mg by mouth daily.      . Saw Palmetto, Serenoa repens, 450 MG CAPS Take 900 mg by mouth 2 (two) times daily.     . vitamin C (ASCORBIC ACID) 500 MG tablet Take 500 mg by mouth daily.       No current facility-administered medications on file prior to visit.     Allergies  Allergen Reactions  . Bee Venom     Past Medical History:  Diagnosis Date  . Hemorrhoids   . HTN (hypertension)   .  Hypertriglyceridemia   . Prostatitis     Past Surgical History:  Procedure Laterality Date  . HEMORRHOID SURGERY  09/21/10   hemorrhoidal banding x2; Rt anterior hemorrhoidectomy  . SKIN CANCER EXCISION     back  . US ECHOCARDIOGRAPHY  07/09/2010    History  Smoking Status  . Never Smoker  Smokeless Tobacco  . Never Used    History  Alcohol Use No    Family History  Problem Relation Age of Onset  . Heart attack Father   . Hypertension Father   . Stroke Father   . Hypertension Brother   . Stroke Brother   . Coronary artery disease Brother   . Stroke Sister   . Colon cancer Neg Hx     Review of Systems: As noted in history of present illness.  All other systems were reviewed and are negative.  Physical Exam: BP 117/76   Pulse 76   Ht 6' (1.829 m)   Wt 194 lb 3.2 oz (88.1 kg)   SpO2 97%   BMI 26.34 kg/m  He is a middle-aged white male in no acute distress. His HEENT exam is unremarkable. He has no JVD or bruits. There is no adenopathy. Lungs are clear. Cardiac exam reveals a regular rate and rhythm without gallop, murmur, or click. Abdomen is soft and  nontender without masses or bruits. Femoral and pedal pulses are 2+ and symmetric. He is alert and oriented x3. Cranial nerves II through XII are intact.  LABORATORY DATA: Lab Results  Component Value Date   WBC 5.5 06/08/2015   HGB 14.5 06/08/2015   HCT 43.1 06/08/2015   PLT 184 06/08/2015   GLUCOSE 100 (H) 06/08/2015   CHOL 174 06/08/2015   TRIG 140 06/08/2015   HDL 38 (L) 06/08/2015   LDLDIRECT 121.8 06/01/2011   LDLCALC 108 06/08/2015   ALT 34 06/08/2015   AST 22 06/08/2015   NA 139 06/08/2015   K 4.3 06/08/2015   CL 103 06/08/2015   CREATININE 0.78 06/08/2015   BUN 12 06/08/2015   CO2 28 06/08/2015   TSH 1.955 06/10/2014   INR 1.1 (H) 10/20/2011   Ecg not done today  Holter monitor: 07/26/16: Study Highlights     Normal sinus rhythm  Rare PACs with one PAC couplet and one PAC triplet  Rare PVCs      Assessment / Plan: 1. Hypertension. Blood pressures under excellent control. Continue amlodipine 5 mg daily.  2. Hypertriglyceridemia. Improved.   3. Palpitations. Prior history of PSVT. Recent Holter showed rare ectopy without PAT. Symptoms do not correlate with arrhythmia by diary. He thinks it may be "nerves". I have recommended avoiding caffeine. Reassured.   Follow up in one year.6 months

## 2016-08-08 ENCOUNTER — Ambulatory Visit: Payer: Self-pay | Admitting: Cardiology

## 2016-08-08 ENCOUNTER — Ambulatory Visit (INDEPENDENT_AMBULATORY_CARE_PROVIDER_SITE_OTHER): Payer: Self-pay | Admitting: Cardiology

## 2016-08-08 ENCOUNTER — Encounter: Payer: Self-pay | Admitting: Cardiology

## 2016-08-08 VITALS — BP 117/76 | HR 76 | Ht 72.0 in | Wt 194.2 lb

## 2016-08-08 DIAGNOSIS — I498 Other specified cardiac arrhythmias: Secondary | ICD-10-CM

## 2016-08-08 DIAGNOSIS — I10 Essential (primary) hypertension: Secondary | ICD-10-CM

## 2016-08-08 NOTE — Patient Instructions (Signed)
Continue your current therapy  I will see you in 6 months.   

## 2016-08-16 NOTE — Telephone Encounter (Signed)
error 

## 2017-04-01 ENCOUNTER — Other Ambulatory Visit: Payer: Self-pay | Admitting: Cardiology

## 2017-04-24 ENCOUNTER — Telehealth: Payer: Self-pay | Admitting: Internal Medicine

## 2017-04-24 NOTE — Telephone Encounter (Signed)
Left message on machine to call back  

## 2017-04-26 NOTE — Telephone Encounter (Signed)
Left message for pt to call back  °

## 2017-04-27 NOTE — Telephone Encounter (Signed)
Pt has not returned phone call.

## 2017-05-27 ENCOUNTER — Ambulatory Visit: Payer: Self-pay | Admitting: Nurse Practitioner

## 2017-05-27 ENCOUNTER — Encounter: Payer: Self-pay | Admitting: Nurse Practitioner

## 2017-05-27 VITALS — BP 120/80 | HR 74 | Temp 98.5°F | Wt 194.8 lb

## 2017-05-27 DIAGNOSIS — R42 Dizziness and giddiness: Secondary | ICD-10-CM

## 2017-05-27 DIAGNOSIS — H6123 Impacted cerumen, bilateral: Secondary | ICD-10-CM

## 2017-05-27 MED ORDER — MECLIZINE HCL 25 MG PO TABS: 25.0000 mg | ORAL_TABLET | Freq: Three times a day (TID) | ORAL | 0 refills | Status: AC | PRN

## 2017-05-27 NOTE — Patient Instructions (Addendum)
Vertigo Vertigo is the feeling that you or your surroundings are moving when they are not. Vertigo can be dangerous if it occurs while you are doing something that could endanger you or others, such as driving. What are the causes? This condition is caused by a disturbance in the signals that are sent by your body's sensory systems to your brain. Different causes of a disturbance can lead to vertigo, including:  Infections, especially in the inner ear.  A bad reaction to a drug, or misuse of alcohol and medicines.  Withdrawal from drugs or alcohol.  Quickly changing positions, as when lying down or rolling over in bed.  Migraine headaches.  Decreased blood flow to the brain.  Decreased blood pressure.  Increased pressure in the brain from a head or neck injury, stroke, infection, tumor, or bleeding.  Central nervous system disorders.  What are the signs or symptoms? Symptoms of this condition usually occur when you move your head or your eyes in different directions. Symptoms may start suddenly, and they usually last for less than a minute. Symptoms may include:  Loss of balance and falling.  Feeling like you are spinning or moving.  Feeling like your surroundings are spinning or moving.  Nausea and vomiting.  Blurred vision or double vision.  Difficulty hearing.  Slurred speech.  Dizziness.  Involuntary eye movement (nystagmus).  Symptoms can be mild and cause only slight annoyance, or they can be severe and interfere with daily life. Episodes of vertigo may return (recur) over time, and they are often triggered by certain movements. Symptoms may improve over time. How is this diagnosed? This condition may be diagnosed based on medical history and the quality of your nystagmus. Your health care provider may test your eye movements by asking you to quickly change positions to trigger the nystagmus. This may be called the Dix-Hallpike test, head thrust test, or roll test.  You may be referred to a health care provider who specializes in ear, nose, and throat (ENT) problems (otolaryngologist) or a provider who specializes in disorders of the central nervous system (neurologist). You may have additional testing, including:  A physical exam.  Blood tests.  MRI.  A CT scan.  An electrocardiogram (ECG). This records electrical activity in your heart.  An electroencephalogram (EEG). This records electrical activity in your brain.  Hearing tests.  How is this treated? Treatment for this condition depends on the cause and the severity of the symptoms. Treatment options include:  Medicines to treat nausea or vertigo. These are usually used for severe cases. Some medicines that are used to treat other conditions may also reduce or eliminate vertigo symptoms. These include: ? Medicines that control allergies (antihistamines). ? Medicines that control seizures (anticonvulsants). ? Medicines that relieve depression (antidepressants). ? Medicines that relieve anxiety (sedatives).  Head movements to adjust your inner ear back to normal. If your vertigo is caused by an ear problem, your health care provider may recommend certain movements to correct the problem.  Surgery. This is rare.  Follow these instructions at home: Safety  Move slowly.Avoid sudden body or head movements.  Avoid driving.  Avoid operating heavy machinery.  Avoid doing any tasks that would cause danger to you or others if you would have a vertigo episode during the task.  If you have trouble walking or keeping your balance, try using a cane for stability. If you feel dizzy or unstable, sit down right away.  Return to your normal activities as told by your  health care provider. Ask your health care provider what activities are safe for you. General instructions  Take over-the-counter and prescription medicines only as told by your health care provider.  Avoid certain positions or  movements as told by your health care provider.  Drink enough fluid to keep your urine clear or pale yellow.  Keep all follow-up visits as told by your health care provider. This is important. Contact a health care provider if:  Your medicines do not relieve your vertigo or they make it worse.  You have a fever.  Your condition gets worse or you develop new symptoms.  Your family or friends notice any behavioral changes.  Your nausea or vomiting gets worse.  You have numbness or a "pins and needles" sensation in part of your body. Get help right away if:  You have difficulty moving or speaking.  You are always dizzy.  You faint.  You develop severe headaches.  You have weakness in your hands, arms, or legs.  You have changes in your hearing or vision.  You develop a stiff neck.  You develop sensitivity to light. This information is not intended to replace advice given to you by your health care provider. Make sure you discuss any questions you have with your health care provider. Document Released: 01/19/2005 Document Revised: 09/23/2015 Document Reviewed: 08/04/2014 Elsevier Interactive Patient Education  2018 [employer].  Earwax Buildup, Adult The ears produce a substance called earwax that helps keep bacteria out of the ear and protects the skin in the ear canal. Occasionally, earwax can build up in the ear and cause discomfort or hearing loss. What increases the risk? This condition is more likely to develop in people who:  Are male.  Are elderly.  Naturally produce more earwax.  Clean their ears often with cotton swabs.  Use earplugs often.  Use in-ear headphones often.  Wear hearing aids.  Have narrow ear canals.  Have earwax that is overly thick or sticky.  Have eczema.  Are dehydrated.  Have excess hair in the ear canal.  What are the signs or symptoms? Symptoms of this condition include:  Reduced or muffled hearing.  A feeling of  fullness in the ear or feeling that the ear is plugged.  Fluid coming from the ear.  Ear pain.  Ear itch.  Ringing in the ear.  Coughing.  An obvious piece of earwax that can be seen inside the ear canal.  How is this diagnosed? This condition may be diagnosed based on:  Your symptoms.  Your medical history.  An ear exam. During the exam, your health care provider will look into your ear with an instrument called an otoscope.  You may have tests, including a hearing test. How is this treated? This condition may be treated by:  Using ear drops to soften the earwax.  Having the earwax removed by a health care provider. The health care provider may: ? Flush the ear with water. ? Use an instrument that has a loop on the end (curette). ? Use a suction device.  Surgery to remove the wax buildup. This may be done in severe cases.  Follow these instructions at home:  Take over-the-counter and prescription medicines only as told by your health care provider.  Do not put any objects, including cotton swabs, into your ear. You can clean the opening of your ear canal with a washcloth or facial tissue.  Follow instructions from your health care provider about cleaning your ears. Do not  over-clean your ears.  Drink enough fluid to keep your urine clear or pale yellow. This will help to thin the earwax.  Keep all follow-up visits as told by your health care provider. If earwax builds up in your ears often or if you use hearing aids, consider seeing your health care provider for routine, preventive ear cleanings. Ask your health care provider how often you should schedule your cleanings.  If you have hearing aids, clean them according to instructions from the manufacturer and your health care provider. Contact a health care provider if:  You have ear pain.  You develop a fever.  You have blood, pus, or other fluid coming from your ear.  You have hearing loss.  You have  ringing in your ears that does not go away.  Your symptoms do not improve with treatment.  You feel like the room is spinning (vertigo). Summary  Earwax can build up in the ear and cause discomfort or hearing loss.  The most common symptoms of this condition include reduced or muffled hearing and a feeling of fullness in the ear or feeling that the ear is plugged.  This condition may be diagnosed based on your symptoms, your medical history, and an ear exam.  This condition may be treated by using ear drops to soften the earwax or by having the earwax removed by a health care provider.  Do not put any objects, including cotton swabs, into your ear. You can clean the opening of your ear canal with a washcloth or facial tissue. This information is not intended to replace advice given to you by your health care provider. Make sure you discuss any questions you have with your health care provider. Document Released: 05/19/2004 Document Revised: 06/22/2016 Document Reviewed: 06/22/2016 Elsevier Interactive Patient Education  2018 [employer] How to Perform the Epley Maneuver The Epley maneuver is an exercise that relieves symptoms of vertigo. Vertigo is the feeling that you or your surroundings are moving when they are not. When you feel vertigo, you may feel like the room is spinning and have trouble walking. Dizziness is a little different than vertigo. When you are dizzy, you may feel unsteady or light-headed. You can do this maneuver at home whenever you have symptoms of vertigo. You can do it up to 3 times a day until your symptoms go away. Even though the Epley maneuver may relieve your vertigo for a few weeks, it is possible that your symptoms will return. This maneuver relieves vertigo, but it does not relieve dizziness. What are the risks? If it is done correctly, the Epley maneuver is considered safe. Sometimes it can lead to dizziness or nausea that goes away after a short time. If  you develop other symptoms, such as changes in vision, weakness, or numbness, stop doing the maneuver and call your health care provider. How to perform the Epley maneuver 1. Sit on the edge of a bed or table with your back straight and your legs extended or hanging over the edge of the bed or table. 2. Turn your head halfway toward the affected ear or side. 3. Lie backward quickly with your head turned until you are lying flat on your back. You may want to position a pillow under your shoulders. 4. Hold this position for 30 seconds. You may experience an attack of vertigo. This is normal. 5. Turn your head to the opposite direction until your unaffected ear is facing the floor. 6. Hold this position for 30 seconds.  You may experience an attack of vertigo. This is normal. Hold this position until the vertigo stops. 7. Turn your whole body to the same side as your head. Hold for another 30 seconds. 8. Sit back up. You can repeat this exercise up to 3 times a day. Follow these instructions at home:  After doing the Epley maneuver, you can return to your normal activities.  Ask your health care provider if there is anything you should do at home to prevent vertigo. He or she may recommend that you: ? Keep your head raised (elevated) with two or more pillows while you sleep. ? Do not sleep on the side of your affected ear. ? Get up slowly from bed. ? Avoid sudden movements during the day. ? Avoid extreme head movement, like looking up or bending over. Contact a health care provider if:  Your vertigo gets worse.  You have other symptoms, including: ? Nausea. ? Vomiting. ? Headache. Get help right away if:  You have vision changes.  You have a severe or worsening headache or neck pain.  You cannot stop vomiting.  You have new numbness or weakness in any part of your body. Summary  Vertigo is the feeling that you or your surroundings are moving when they are not.  The Epley  maneuver is an exercise that relieves symptoms of vertigo.  If the Epley maneuver is done correctly, it is considered safe. You can do it up to 3 times a day. This information is not intended to replace advice given to you by your health care provider. Make sure you discuss any questions you have with your health care provider. Document Released: 04/16/2013 Document Revised: 03/01/2016 Document Reviewed: 03/01/2016 Elsevier Interactive Patient Education  2017 [employer] How to Perform the Epley Maneuver The Epley maneuver is an exercise that relieves symptoms of vertigo. Vertigo is the feeling that you or your surroundings are moving when they are not. When you feel vertigo, you may feel like the room is spinning and have trouble walking. Dizziness is a little different than vertigo. When you are dizzy, you may feel unsteady or light-headed. You can do this maneuver at home whenever you have symptoms of vertigo. You can do it up to 3 times a day until your symptoms go away. Even though the Epley maneuver may relieve your vertigo for a few weeks, it is possible that your symptoms will return. This maneuver relieves vertigo, but it does not relieve dizziness. What are the risks? If it is done correctly, the Epley maneuver is considered safe. Sometimes it can lead to dizziness or nausea that goes away after a short time. If you develop other symptoms, such as changes in vision, weakness, or numbness, stop doing the maneuver and call your health care provider. How to perform the Epley maneuver 9. Sit on the edge of a bed or table with your back straight and your legs extended or hanging over the edge of the bed or table. 10. Turn your head halfway toward the affected ear or side. 11. Lie backward quickly with your head turned until you are lying flat on your back. You may want to position a pillow under your shoulders. 12. Hold this position for 30 seconds. You may experience an attack of vertigo.  This is normal. 13. Turn your head to the opposite direction until your unaffected ear is facing the floor. 14. Hold this position for 30 seconds. You may experience an attack of vertigo. This is normal.  Hold this position until the vertigo stops. 15. Turn your whole body to the same side as your head. Hold for another 30 seconds. 16. Sit back up. You can repeat this exercise up to 3 times a day. Follow these instructions at home:  After doing the Epley maneuver, you can return to your normal activities.  Ask your health care provider if there is anything you should do at home to prevent vertigo. He or she may recommend that you: ? Keep your head raised (elevated) with two or more pillows while you sleep. ? Do not sleep on the side of your affected ear. ? Get up slowly from bed. ? Avoid sudden movements during the day. ? Avoid extreme head movement, like looking up or bending over. Contact a health care provider if:  Your vertigo gets worse.  You have other symptoms, including: ? Nausea. ? Vomiting. ? Headache. Get help right away if:  You have vision changes.  You have a severe or worsening headache or neck pain.  You cannot stop vomiting.  You have new numbness or weakness in any part of your body. Summary  Vertigo is the feeling that you or your surroundings are moving when they are not.  The Epley maneuver is an exercise that relieves symptoms of vertigo.  If the Epley maneuver is done correctly, it is considered safe. You can do it up to 3 times a day. This information is not intended to replace advice given to you by your health care provider. Make sure you discuss any questions you have with your health care provider. Document Released: 04/16/2013 Document Revised: 03/01/2016 Document Reviewed: 03/01/2016 Elsevier Interactive Patient Education  2017 [employer].

## 2017-05-27 NOTE — Progress Notes (Signed)
Subjective:     Ricky Bennett is a 57 y.o. male who presents for evaluation of dizziness. The symptoms started several hours ago and are improved. The attacks occur starting today when he was awakening from sleep and last 2 minutes. Positions that worsen symptoms: lying down, rolling in bed to the left and rolling in bed to the right. Previous workup/treatments: none. Associated ear symptoms: none. Associated CNS symptoms: none and nausea. Recent infections: none. Head trauma: denied. Drug ingestion: none. Noise exposure: no occupational exposure. Family history: non-contributory.  The following portions of the patient's history were reviewed and updated as appropriate: allergies, current medications and past medical history.  Review of Systems Constitutional: negative Eyes: negative Ears, nose, mouth, throat, and face: positive for cerumen build-up, negative for ear drainage, earaches, hearing loss, hoarseness, nasal congestion, sore throat and tinnitus Respiratory: negative Cardiovascular: negative Neurological: positive for dizziness and vertigo, negative for coordination problems, gait problems, headaches, memory problems, paresthesia, speech problems, vertigo and weakness    Objective:    BP 120/80   Pulse 74   Temp 98.5 F (36.9 C)   Wt 194 lb 12.8 oz (88.4 kg)   SpO2 96%   BMI 26.42 kg/m  General appearance: alert, cooperative and no distress Head: Normocephalic, without obvious abnormality, atraumatic Eyes: conjunctivae/corneas clear. PERRL, EOM's intact. Fundi benign. Ears: abnormal TM right ear - moderate cerumen and abnormal TM left ear - moderate cerumen Nose: Nares normal. Septum midline. Mucosa normal. No drainage or sinus tenderness. Throat: lips, mucosa, and tongue normal; teeth and gums normal Lungs: clear to auscultation bilaterally Heart: regular rate and rhythm, S1, S2 normal, no murmur, click, rub or gallop Pulses: 2+ and symmetric Skin: Skin color, texture,  turgor normal. No rashes or lesions Neurologic: Grossly normal      Ear irrigation performed bilaterally.  Post irrigation, left canal without erythema, TM opaque, able to visualize landmarks, no erythema or bulging.  Right canal without erythema, TM opaque, able to visualize landmarks, no erythema or bulging.   Assessment:    Vertigo and Cerumen Impaction, Bilateral   Plan:    Meclizine per medication orders. OTC decongestants. The nature of vertigo syndromes were discussed along with their usual course and treatment. Educational materials given and questions answered.

## 2017-06-01 ENCOUNTER — Encounter: Payer: Self-pay | Admitting: Gastroenterology

## 2017-06-01 ENCOUNTER — Ambulatory Visit: Payer: Self-pay | Admitting: Gastroenterology

## 2017-06-01 ENCOUNTER — Telehealth: Payer: Self-pay

## 2017-06-01 VITALS — BP 110/76 | HR 74 | Ht 71.0 in | Wt 191.5 lb

## 2017-06-01 DIAGNOSIS — R1031 Right lower quadrant pain: Secondary | ICD-10-CM | POA: Insufficient documentation

## 2017-06-01 DIAGNOSIS — K5790 Diverticulosis of intestine, part unspecified, without perforation or abscess without bleeding: Secondary | ICD-10-CM

## 2017-06-01 NOTE — Progress Notes (Addendum)
06/01/2017 Lenora BoysGregory Espina [id number] November 08, 1960   HISTORY OF PRESENT ILLNESS: This is a pleasant 57 year old male who is known to Dr. Rhea BeltonPyrtle for colonoscopy in December 2016.  At that time he was found diverticulosis in the transverse, descending, and sigmoid colon.  Repeat was recommended in 10 years from that time.  He has been treated empirically on a couple of occasions in the past for possible diverticulitis, but we do not have any radiographic evidence to confirm this diagnosis.  He presents to the office today with complaints of right lower quadrant/right pelvic pain that has been present for the past 2 and half months or so.  He says that it is just a constant pain/discomfort that seems to be worse when bearing down, bending over, etc.  He then tells me that about a month or so ago he started with some left-sided abdominal pain that was moderate in severity.  He says that he changed his diet.  Did have some diarrhea with this left sided pain as well.  He says that the pain slowly improved, but was present for about a month or so before completely subsiding and resolving.  He says that now for the past 2 and half weeks or so has not had any of that left lower quadrant abdominal pain, but has continued to have the right-sided abdominal/pelvic pain.   Past Medical History:  Diagnosis Date  . Hemorrhoids   . HTN (hypertension)   . Hypertriglyceridemia   . Prostatitis    Past Surgical History:  Procedure Laterality Date  . HEMORRHOID SURGERY  09/21/10   hemorrhoidal banding x2; Rt anterior hemorrhoidectomy  . SKIN CANCER EXCISION     back  . US ECHOCARDIOGRAPHY  07/09/2010    reports that  has never smoked. he has never used smokeless tobacco. He reports that he does not drink alcohol or use drugs. family history includes Coronary artery disease in his brother; Heart attack in his father; Hypertension in his brother and father; Stroke in his brother, father, and sister. Allergies    Allergen Reactions  . Bee Venom       Outpatient Encounter Medications as of 06/01/2017  Medication Sig  . Acai 500 MG CAPS Take 1,000 mg by mouth.   Marland Kitchen. ALFALFA PO Take by mouth as directed.  Marland Kitchen. amLODipine (NORVASC) 5 MG tablet Take 1 tablet (5 mg total) by mouth daily.  . Arginine 500 MG CAPS Take by mouth. 400 mg  . Ascorbic Acid (VITAMIN C) 100 MG tablet Take 100 mg by mouth daily.  Marland Kitchen. BIOTIN PO Take by mouth.  . cholecalciferol (VITAMIN D) 1000 UNITS tablet Take 1,000 Units by mouth daily.  . Cinnamon 500 MG TABS Take by mouth.    Marland Kitchen. Cod Liver Oil 1250-130 UNITS CAPS Take by mouth.    . Coenzyme Q10 (CO Q-10) 200 MG CAPS Take by mouth.    . Cranberry 250 MG CAPS Take by mouth.    . Cyanocobalamin (B-12) 500 MCG TABS Take 1,000 mcg by mouth.   . dicyclomine (BENTYL) 10 MG capsule Take 1 capsule (10 mg total) by mouth 3 (three) times daily as needed for spasms.  Marland Kitchen. EPINEPHrine 0.3 mg/0.3 mL IJ SOAJ injection Inject 0.3 mLs (0.3 mg total) into the muscle once.  . Flaxseed, Linseed, (FLAX SEED OIL PO) Take 2,400 mg by mouth.    . folic acid (FOLVITE) 800 MCG tablet Take 400 mcg by mouth daily.   . Garlic 2000 MG CAPS  Take 75 mg by mouth.   . Glucosamine-Chondroitin-Vit C 2000-1200-60 MG/30ML LIQD Take by mouth.    . Grape Seed 50 MG TABS Take by mouth.    Asencion Gowda 550 MG CAPS Take by mouth.    . irbesartan (AVAPRO) 300 MG tablet TAKE ONE TABLET BY MOUTH DAILY  . Magnesium 250 MG TABS Take 500 mg by mouth.   . meclizine (ANTIVERT) 25 MG tablet Take 1 tablet (25 mg total) by mouth 3 (three) times daily as needed for up to 10 days for dizziness.  . Niacin-Inositol 400-100 MG CAPS Take by mouth.    . Omega-3 Fatty Acids (FISH OIL) 1000 MG CPDR Take by mouth.  . Phytosterol Esters (PHYTOSTEROL COMPLEX) 500 MG CAPS Take by mouth.    . pyridOXINE (VITAMIN B-6) 100 MG tablet Take 100 mg by mouth daily.    . Saw Palmetto, Serenoa repens, 450 MG CAPS Take 900 mg by mouth 2 (two) times  daily.   . vitamin C (ASCORBIC ACID) 500 MG tablet Take 500 mg by mouth daily.     No facility-administered encounter medications on file as of 06/01/2017.      REVIEW OF SYSTEMS  : All other systems reviewed and negative except where noted in the History of Present Illness.   PHYSICAL EXAM: BP 110/76   Pulse 74   Ht 5\' 11"  (1.803 m)   Wt 191 lb 8 oz (86.9 kg)   BMI 26.71 kg/m  General: Well developed white male in no acute distress Head: Normocephalic and atraumatic Eyes:  Sclerae anicteric, conjunctiva pink. Ears: Normal auditory acuity Lungs: Clear throughout to auscultation; no increased WOB. Heart: Regular rate and rhythm; no M/R/G. Abdomen: Soft, non-distended.  BS present.  Mild RLQ TTP. Musculoskeletal: Symmetrical with no gross deformities  Skin: No lesions on visible extremities Extremities: No edema  Neurological: Alert oriented x 4, grossly non-focal Psychological:  Alert and cooperative. Normal mood and affect  ASSESSMENT AND PLAN: *RLQ abdominal pain:  2.5 months.  Unsure of the cause.  ? Inguinal hernia although none appreciated on exam today.  ? Musculoskeletal.  Will order CT scan of the abdomen and pelvis with contrast for evaluation to rule out intra-abdominal and pelvic pathology.   CC:  No ref. provider found  Addendum: Reviewed and agree with initial management. Pyrtle, Carie Caddy, MD

## 2017-06-01 NOTE — Patient Instructions (Addendum)
If you are age 58 or older, your body mass index should be between 23-30. Your Body mass index is 26.71 kg/m. If this is out of the aforementioned range listed, please consider follow up with your Primary Care Provider.  If you are age 5 or younger, your body mass index should be between 19-25. Your Body mass index is 26.71 kg/m. If this is out of the aformentioned range listed, please consider follow up with your Primary Care Provider.   You have been scheduled for a CT scan of the abdomen and pelvis at [facility] ([address]---this is in the same building as [facility]).   You are scheduled on 06/22/17 at 1:00 pm. You should arrive 15 minutes prior to your appointment time for registration. Please follow the written instructions below on the day of your exam:  WARNING: IF YOU ARE ALLERGIC TO IODINE/X-RAY DYE, PLEASE NOTIFY RADIOLOGY IMMEDIATELY AT [phone]! YOU WILL BE GIVEN A 13 HOUR PREMEDICATION PREP.  1) Do not eat anything after 9:00 am (4 hours prior to your test) 2) You have been given 2 bottles of oral contrast to drink. The solution may taste               better if refrigerated, but do NOT add ice or any other liquid to this solution. Shake             well before drinking.    Drink 1 bottle of contrast @ 11:00 am (2 hours prior to your exam)  Drink 1 bottle of contrast @ 12:00 pm (1 hour prior to your exam)  You may take any medications as prescribed with a small amount of water except for the following: Metformin, Glucophage, Glucovance, Avandamet, Riomet, Fortamet, Actoplus Met, Janumet, Glumetza or Metaglip. The above medications must be held the day of the exam AND 48 hours after the exam.  The purpose of you drinking the oral contrast is to aid in the visualization of your intestinal tract. The contrast solution may cause some diarrhea. Before your exam is started, you will be given a small amount of fluid to drink. Depending on your individual set  of symptoms, you may also receive an intravenous injection of x-ray contrast/dye. Plan on being at [facility] for 30 minutes or longer, depending on the type of exam you are having performed.  This test typically takes 30-45 minutes to complete.  If you have any questions regarding your exam or if you need to reschedule, you may call the CT department at [phone] between the hours of 8:00 am and 5:00 pm, Monday-Friday.  ______________________________________________________________________Will call with results.  Thank you for choosing me and [facility].    Alonza Bogus, PA-C

## 2017-06-22 ENCOUNTER — Other Ambulatory Visit: Payer: Self-pay

## 2017-06-27 ENCOUNTER — Ambulatory Visit (INDEPENDENT_AMBULATORY_CARE_PROVIDER_SITE_OTHER)
Admission: RE | Admit: 2017-06-27 | Discharge: 2017-06-27 | Disposition: A | Discharge status: Home / Self Care | Payer: Self-pay | Source: Ambulatory Visit | Attending: Gastroenterology | Admitting: Gastroenterology

## 2017-06-27 DIAGNOSIS — R1031 Right lower quadrant pain: Secondary | ICD-10-CM

## 2017-06-27 DIAGNOSIS — K5790 Diverticulosis of intestine, part unspecified, without perforation or abscess without bleeding: Secondary | ICD-10-CM

## 2017-06-27 MED ORDER — IOPAMIDOL (ISOVUE-300) INJECTION 61%
100.0000 mL | Freq: Once | INTRAVENOUS | Status: AC | PRN
  Administered 2017-06-27: 100 mL via INTRAVENOUS

## 2017-06-29 ENCOUNTER — Telehealth: Payer: Self-pay | Admitting: Gastroenterology

## 2017-06-30 NOTE — Telephone Encounter (Signed)
Called pt back and he states he cannot talk right now. Instructed pt to call back when he could talk.  Multiple questions regarding CT report. Reviewed report and results with pt and questions were answered.

## 2017-08-12 ENCOUNTER — Other Ambulatory Visit: Payer: Self-pay | Admitting: Cardiology

## 2017-09-13 ENCOUNTER — Telehealth: Payer: Self-pay | Admitting: *Deleted

## 2017-09-13 ENCOUNTER — Other Ambulatory Visit: Payer: Self-pay

## 2017-09-13 MED ORDER — CANDESARTAN CILEXETIL 32 MG PO TABS: 32.0000 mg | ORAL_TABLET | Freq: Every day | ORAL | 3 refills | Status: DC

## 2017-09-13 MED ORDER — CANDESARTAN CILEXETIL 32 MG PO TABS: 32.0000 mg | ORAL_TABLET | Freq: Every day | ORAL | 6 refills | Status: DC

## 2017-09-13 NOTE — Telephone Encounter (Signed)
Spoke to patient Ricky Bennett 32 mg daily sent to [facility] at [place].

## 2017-09-13 NOTE — Telephone Encounter (Signed)
Returned call to patient no answer.Left message on personal voice mail Dr.Jordan advised to stop Irbesartan and start Candesartan 32 mg daily.Prescription sent to [facility] in [facility].

## 2017-09-13 NOTE — Telephone Encounter (Signed)
Patient left a msg on the refill vm stating that he is very upset that he had to find out from his pharmacy that he needed an office visit with Dr Swaziland. He says that the office needs to be more efficient and that he should have received a letter, phone call or mychart msg reminding him of the need for an appointment. He was given much "static" about getting refills. He went on to say that he went to the pharmacy yesterday and was informed that they are out of irbesartan and the office failed to respond to their request for an alternative. He needs an alternative medication to be prescribed today so that he will have a dose to take tonight. He requested a call back at [phone]. Thanks, MI

## 2017-09-13 NOTE — Addendum Note (Signed)
Addended by: Neoma Laming on: 09/13/2017 05:21 PM   Modules accepted: Orders

## 2017-09-14 ENCOUNTER — Telehealth: Payer: Self-pay | Admitting: Cardiology

## 2017-09-14 MED ORDER — LOSARTAN POTASSIUM 100 MG PO TABS: 100.0000 mg | ORAL_TABLET | Freq: Every day | ORAL | 6 refills | Status: DC

## 2017-09-14 NOTE — Telephone Encounter (Signed)
Irbesartan changed to candesartan yesterday after Irbesartan was not available at his prefered pharmacy.   May try valsartan  daily   Or   Losartan  daily  If alternatives available at his pharmacy.

## 2017-09-14 NOTE — Telephone Encounter (Signed)
New Message   Pt c/o medication issue:  1. Name of Medication: candesartan (ATACAND) 32 MG tablet  2. How are you currently taking this medication (dosage and times per day)?   3. Are you having a reaction (difficulty breathing--STAT)?   4. What is your medication issue? Patient is calling because the pharmacy does not have the candesartan available until Monday as well as the cost of the candesartan is too expensive. Patient is requesting something else. Please call.

## 2017-09-14 NOTE — Telephone Encounter (Signed)
Returned call to patient no answer.Left message on personal voice mail Dr.Jordan out of office, message sent to our pharmacist she recommended Losartan 100 mg daily.

## 2017-09-14 NOTE — Telephone Encounter (Signed)
Called [facility] to verify-they can order medication but patient did not want this to be ordered as it was too expensive.  This is going to cost $93 for 30 day supply.    Routed to MD /pharmD for alternative

## 2017-10-02 ENCOUNTER — Other Ambulatory Visit: Payer: Self-pay | Admitting: Cardiology

## 2017-10-02 ENCOUNTER — Telehealth: Payer: Self-pay | Admitting: Cardiology

## 2017-10-02 MED ORDER — AMLODIPINE BESYLATE 5 MG PO TABS: 5.0000 mg | ORAL_TABLET | Freq: Every day | ORAL | 0 refills | Status: DC

## 2017-10-02 NOTE — Telephone Encounter (Signed)
°*  STAT* If patient is at the pharmacy, call can be transferred to refill team.   1. Which medications need to be refilled? (please list name of each medication and dose if known) Amlodipine 5mg    2. Which pharmacy/location (including street and city if local pharmacy) is medication to be sent to[place] [address] [place]/phone #[phone]  3. Do they need a 30 day or 90 day supply? 90

## 2017-10-02 NOTE — Telephone Encounter (Signed)
REFILL 

## 2017-10-02 NOTE — Telephone Encounter (Signed)
Rx(s) sent to pharmacy electronically.  

## 2017-12-30 NOTE — Progress Notes (Signed)
Ricky Bennett Date of Birth: 1961/03/07   History of Present Illness: Mr. Cavanah is seen for followup PSVT and HTN. He  has a history of nonsustained PSVT.   On follow up today he is doing very well. States palpitations are much better since he is divorced. No chest pain or dyspnea. Exercises regularly. Tolerating medication well.    Current Outpatient Medications on File Prior to Visit  Medication Sig Dispense Refill  . Acai 500 MG CAPS Take 1,000 mg by mouth.     Marland Kitchen ALFALFA PO Take by mouth as directed.    Marland Kitchen amLODipine (NORVASC) 5 MG tablet Take 1 tablet (5 mg total) by mouth daily. MUST KEEP APPOINTMENT 01/03/18 WITH DR Swaziland FOR FUTURE REFILLS 180 tablet 0  . Arginine 500 MG CAPS Take by mouth. 400 mg    . BIOTIN PO Take by mouth.    . cholecalciferol (VITAMIN D) 1000 UNITS tablet Take 1,000 Units by mouth daily.    . Cinnamon 500 MG TABS Take by mouth.      Marland Kitchen Cod Liver Oil 1250-130 UNITS CAPS Take by mouth.      . Coenzyme Q10 (CO Q-10) 200 MG CAPS Take by mouth.      . Cranberry 250 MG CAPS Take by mouth.      . Cyanocobalamin (B-12) 500 MCG TABS Take 1,000 mcg by mouth.     . EPINEPHrine 0.3 mg/0.3 mL IJ SOAJ injection Inject 0.3 mLs (0.3 mg total) into the muscle once. 2 Device 1  . Flaxseed, Linseed, (FLAX SEED OIL PO) Take 2,400 mg by mouth.      . folic acid (FOLVITE) 800 MCG tablet Take 400 mcg by mouth daily.     . Garlic 2000 MG CAPS Take 75 mg by mouth.     . Glucosamine-Chondroitin-Vit C 2000-1200-60 MG/30ML LIQD Take by mouth.      Asencion Gowda 550 MG CAPS Take by mouth.      . Magnesium 250 MG TABS Take 500 mg by mouth.     . Niacin-Inositol 400-100 MG CAPS Take by mouth.      . Omega-3 Fatty Acids (FISH OIL) 1000 MG CPDR Take by mouth.    . Phytosterol Esters (PHYTOSTEROL COMPLEX) 500 MG CAPS Take by mouth.      . pyridOXINE (VITAMIN B-6) 100 MG tablet Take 100 mg by mouth daily.      . Saw Palmetto, Serenoa repens, 450 MG CAPS Take 900 mg by mouth 2 (two)  times daily.     . vitamin C (ASCORBIC ACID) 500 MG tablet Take 500 mg by mouth daily.      Marland Kitchen losartan (COZAAR) 100 MG tablet Take 1 tablet (100 mg total) by mouth daily. 30 tablet 6   No current facility-administered medications on file prior to visit.     Allergies  Allergen Reactions  . Bee Venom     Past Medical History:  Diagnosis Date  . Hemorrhoids   . HTN (hypertension)   . Hypertriglyceridemia   . Prostatitis     Past Surgical History:  Procedure Laterality Date  . HEMORRHOID SURGERY  09/21/10   hemorrhoidal banding x2; Rt anterior hemorrhoidectomy  . SKIN CANCER EXCISION     back  . US ECHOCARDIOGRAPHY  07/09/2010    Social History   Tobacco Use  Smoking Status Never Smoker  Smokeless Tobacco Never Used    Social History   Substance and Sexual Activity  Alcohol Use No  .  Alcohol/week: 0.0 standard drinks    Family History  Problem Relation Age of Onset  . Heart attack Father   . Hypertension Father   . Stroke Father   . Hypertension Brother   . Stroke Brother   . Coronary artery disease Brother   . Stroke Sister   . Colon cancer Neg Hx     Review of Systems: As noted in history of present illness.  All other systems were reviewed and are negative.  Physical Exam: BP 116/80 (BP Location: Right Arm, Patient Position: Sitting, Cuff Size: Normal)   Pulse 69   Ht 5\' 11"  (1.803 m)   Wt 186 lb (84.4 kg)   BMI 25.94 kg/m  GENERAL:  Well appearing WM in NAD HEENT:  PERRL, EOMI, sclera are clear. Oropharynx is clear. NECK:  No jugular venous distention, carotid upstroke brisk and symmetric, no bruits, no thyromegaly or adenopathy LUNGS:  Clear to auscultation bilaterally CHEST:  Unremarkable HEART:  RRR,  PMI not displaced or sustained,S1 and S2 within normal limits, no S3, no S4: no clicks, no rubs, no murmurs ABD:  Soft, nontender. BS +, no masses or bruits. No hepatomegaly, no splenomegaly EXT:  2 + pulses throughout, no edema, no cyanosis no  clubbing SKIN:  Warm and dry.  No rashes NEURO:  Alert and oriented x 3. Cranial nerves II through XII intact. PSYCH:  Cognitively intact    LABORATORY DATA: Lab Results  Component Value Date   WBC 5.5 06/08/2015   HGB 14.5 06/08/2015   HCT 43.1 06/08/2015   PLT 184 06/08/2015   GLUCOSE 100 (H) 06/08/2015   CHOL 174 06/08/2015   TRIG 140 06/08/2015   HDL 38 (L) 06/08/2015   LDLDIRECT 121.8 06/01/2011   LDLCALC 108 06/08/2015   ALT 34 06/08/2015   AST 22 06/08/2015   NA 139 06/08/2015   K 4.3 06/08/2015   CL 103 06/08/2015   CREATININE 0.78 06/08/2015   BUN 12 06/08/2015   CO2 28 06/08/2015   TSH 1.955 06/10/2014   INR 1.1 (H) 10/20/2011   Ecg is  done today- NSR normal Ecg. I have personally reviewed and interpreted this study.   Holter monitor: 07/26/16: Study Highlights     Normal sinus rhythm  Rare PACs with one PAC couplet and one PAC triplet  Rare PVCs      Assessment / Plan: 1. Hypertension. Blood pressures under excellent control. Continue amlodipine 5 mg daily and losartan. Check chemistries today.  2. Hypertriglyceridemia. Will check lab work today.  3. Palpitations. Prior history of PSVT. Well controlled. Check CBC and TSH.  Follow up in one year.

## 2018-01-03 ENCOUNTER — Ambulatory Visit (INDEPENDENT_AMBULATORY_CARE_PROVIDER_SITE_OTHER): Payer: Self-pay | Admitting: Cardiology

## 2018-01-03 ENCOUNTER — Encounter: Payer: Self-pay | Admitting: Cardiology

## 2018-01-03 VITALS — BP 116/80 | HR 69 | Ht 71.0 in | Wt 186.0 lb

## 2018-01-03 DIAGNOSIS — I471 Supraventricular tachycardia: Secondary | ICD-10-CM

## 2018-01-03 DIAGNOSIS — I10 Essential (primary) hypertension: Secondary | ICD-10-CM

## 2018-01-03 NOTE — Patient Instructions (Addendum)
We will check blood work today  Continue your current therapy  I will see you in one year  

## 2018-01-04 LAB — HEPATIC FUNCTION PANEL
ALBUMIN: 4.7 g/dL (ref 3.5–5.5)
ALT: 32 IU/L (ref 0–44)
AST: 21 IU/L (ref 0–40)
Alkaline Phosphatase: 71 IU/L (ref 39–117)
BILIRUBIN TOTAL: 0.7 mg/dL (ref 0.0–1.2)
Bilirubin, Direct: 0.16 mg/dL (ref 0.00–0.40)
Total Protein: 6.7 g/dL (ref 6.0–8.5)

## 2018-01-04 LAB — CBC WITH DIFFERENTIAL/PLATELET
Basophils Absolute: 0 10*3/uL (ref 0.0–0.2)
Basos: 1 %
EOS (ABSOLUTE): 0.1 10*3/uL (ref 0.0–0.4)
Eos: 1 %
HEMATOCRIT: 42.6 % (ref 37.5–51.0)
HEMOGLOBIN: 15 g/dL (ref 13.0–17.7)
IMMATURE GRANS (ABS): 0 10*3/uL (ref 0.0–0.1)
IMMATURE GRANULOCYTES: 0 %
LYMPHS: 26 %
Lymphocytes Absolute: 1.5 10*3/uL (ref 0.7–3.1)
MCH: 32.3 pg (ref 26.6–33.0)
MCHC: 35.2 g/dL (ref 31.5–35.7)
MCV: 92 fL (ref 79–97)
MONOCYTES: 7 %
Monocytes Absolute: 0.4 10*3/uL (ref 0.1–0.9)
NEUTROS ABS: 3.7 10*3/uL (ref 1.4–7.0)
Neutrophils: 65 %
Platelets: 181 10*3/uL (ref 150–450)
RBC: 4.64 x10E6/uL (ref 4.14–5.80)
RDW: 12.1 % — ABNORMAL LOW (ref 12.3–15.4)
WBC: 5.7 10*3/uL (ref 3.4–10.8)

## 2018-01-04 LAB — BASIC METABOLIC PANEL
BUN/Creatinine Ratio: 16 (ref 9–20)
BUN: 12 mg/dL (ref 6–24)
CHLORIDE: 103 mmol/L (ref 96–106)
CO2: 25 mmol/L (ref 20–29)
Calcium: 9.6 mg/dL (ref 8.7–10.2)
Creatinine, Ser: 0.76 mg/dL (ref 0.76–1.27)
GFR calc Af Amer: 115 mL/min/{1.73_m2} (ref >59)
GFR, EST NON AFRICAN AMERICAN: 100 mL/min/{1.73_m2} (ref >59)
Glucose: 101 mg/dL — ABNORMAL HIGH (ref 65–99)
Potassium: 4.5 mmol/L (ref 3.5–5.2)
Sodium: 141 mmol/L (ref 134–144)

## 2018-01-04 LAB — LIPID PANEL W/O CHOL/HDL RATIO
Cholesterol, Total: 169 mg/dL (ref 100–199)
HDL: 39 mg/dL — ABNORMAL LOW (ref >39)
LDL Calculated: 99 mg/dL (ref 0–99)
Triglycerides: 156 mg/dL — ABNORMAL HIGH (ref 0–149)
VLDL CHOLESTEROL CAL: 31 mg/dL (ref 5–40)

## 2018-01-04 LAB — TSH: TSH: 1.79 u[IU]/mL (ref 0.450–4.500)

## 2018-03-15 ENCOUNTER — Other Ambulatory Visit: Payer: Self-pay | Admitting: Cardiology

## 2018-03-15 ENCOUNTER — Other Ambulatory Visit: Payer: Self-pay | Admitting: *Deleted

## 2018-03-15 MED ORDER — LOSARTAN POTASSIUM 100 MG PO TABS: 100.0000 mg | ORAL_TABLET | Freq: Every day | ORAL | 1 refills | Status: DC

## 2018-09-10 ENCOUNTER — Other Ambulatory Visit: Payer: Self-pay | Admitting: Cardiology

## 2018-12-30 ENCOUNTER — Other Ambulatory Visit: Payer: Self-pay | Admitting: Cardiology

## 2019-02-06 NOTE — Progress Notes (Signed)
Debroah Bennett Date of Birth: [birth year]   History of Present Illness: Ricky Bennett is seen for followup PSVT and HTN. He  has a history of nonsustained PSVT.  He is on losartan and amlodipine for BP control.   On follow up today he is doing very well. 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 No chest pain or dyspnea. Exercises regularly. Tolerating medication well.    Current Outpatient Medications on File Prior to Visit  Medication Sig Dispense Refill  . Acai 500 MG CAPS Take 1,000 mg by mouth.     Marland Kitchen ALFALFA PO Take by mouth as directed.    . Arginine 500 MG CAPS Take by mouth. 400 mg    . BIOTIN PO Take by mouth.    . cholecalciferol (VITAMIN D) 1000 UNITS tablet Take 1,000 Units by mouth daily.    . Cinnamon 500 MG TABS Take by mouth.      Marland Kitchen Cod Liver Oil 1250-130 UNITS CAPS Take by mouth.      . Coenzyme Q10 (CO Q-10) 200 MG CAPS Take by mouth.      . Cranberry 250 MG CAPS Take by mouth.      . Cyanocobalamin (B-12) 500 MCG TABS Take 1,000 mcg by mouth.     . EPINEPHrine 0.3 mg/0.3 mL IJ SOAJ injection Inject 0.3 mLs (0.3 mg total) into the muscle once. 2 Device 1  . Flaxseed, Linseed, (FLAX SEED OIL PO) Take 2,400 mg by mouth.      . folic acid (FOLVITE) 063 MCG tablet Take 400 mcg by mouth daily.     . Garlic 0160 MG CAPS Take 75 mg by mouth.     . Glucosamine-Chondroitin-Vit C 2000-1200-60 MG/30ML LIQD Take by mouth.      Cathrine Bennett 550 MG CAPS Take by mouth.      . losartan (COZAAR) 100 MG tablet TAKE ONE TABLET BY MOUTH DAILY 90 tablet 3  . Magnesium 250 MG TABS Take 500 mg by mouth.     . Niacin-Inositol 400-100 MG CAPS Take by mouth.      . Omega-3 Fatty Acids (FISH OIL) 1000 MG CPDR Take by mouth.    . Phytosterol Esters (PHYTOSTEROL COMPLEX) 500 MG CAPS Take by mouth.      . pyridOXINE (VITAMIN B-6) 100 MG tablet Take 100 mg by mouth daily.      . Saw Palmetto, Serenoa repens, 450 MG CAPS Take 900 mg by mouth 2 (two) times daily.     . vitamin C  (ASCORBIC ACID) 500 MG tablet Take 500 mg by mouth daily.       No current facility-administered medications on file prior to visit.     Allergies  Allergen Reactions  . Bee Venom     Past Medical History:  Diagnosis Date  . Hemorrhoids   . HTN (hypertension)   . Hypertriglyceridemia   . Prostatitis     Past Surgical History:  Procedure Laterality Date  . HEMORRHOID SURGERY  09/21/10   hemorrhoidal banding x2; Rt anterior hemorrhoidectomy  . SKIN CANCER EXCISION     back  . US ECHOCARDIOGRAPHY  07/09/2010    Social History   Tobacco Use  Smoking Status Never Smoker  Smokeless Tobacco Never Used    Social History   Substance and Sexual Activity  Alcohol Use No  . Alcohol/week: 0.0 standard drinks    Family History  Problem Relation Age of Onset  . Heart attack Father   . Hypertension  Father   . Stroke Father   . Hypertension Brother   . Stroke Brother   . Coronary artery disease Brother   . Stroke Sister   . Colon cancer Neg Hx     Review of Systems: As noted in history of present illness.  All other systems were reviewed and are negative.  Physical Exam: BP 122/80   Pulse 73   Ht 5\' 11"  (1.803 m)   Wt 189 lb 12.8 oz (86.1 kg)   BMI 26.47 kg/m  GENERAL:  Well appearing WM in NAD HEENT:  PERRL, EOMI, sclera are clear. Oropharynx is clear. NECK:  No jugular venous distention, carotid upstroke brisk and symmetric, no bruits, no thyromegaly or adenopathy LUNGS:  Clear to auscultation bilaterally CHEST:  Unremarkable HEART:  RRR,  PMI not displaced or sustained,S1 and S2 within normal limits, no S3, no S4: no clicks, no rubs, no murmurs ABD:  Soft, nontender. BS +, no masses or bruits. No hepatomegaly, no splenomegaly EXT:  2 + pulses throughout, no edema, no cyanosis no clubbing SKIN:  Warm and dry.  No rashes NEURO:  Alert and oriented x 3. Cranial nerves II through XII intact. PSYCH:  Cognitively intact    LABORATORY DATA: Lab Results   Component Value Date   WBC 5.7 01/03/2018   HGB 15.0 01/03/2018   HCT 42.6 01/03/2018   PLT 181 01/03/2018   GLUCOSE 101 (H) 01/03/2018   CHOL 169 01/03/2018   TRIG 156 (H) 01/03/2018   HDL 39 (L) 01/03/2018   LDLDIRECT 121.8 06/01/2011   LDLCALC 99 01/03/2018   ALT 32 01/03/2018   AST 21 01/03/2018   NA 141 01/03/2018   K 4.5 01/03/2018   CL 103 01/03/2018   CREATININE 0.76 01/03/2018   BUN 12 01/03/2018   CO2 25 01/03/2018   TSH 1.790 01/03/2018   INR 1.1 (H) 10/20/2011   Ecg is  done today- NSR normal Ecg.rate 73. I have personally reviewed and interpreted this study.   Holter monitor: 07/26/16: Study Highlights     Normal sinus rhythm  Rare PACs with one PAC couplet and one PAC triplet  Rare PVCs      Assessment / Plan: 1. Hypertension. Blood pressures under excellent control. Continue amlodipine 5 mg daily and losartan.   2. Hypertriglyceridemia. Borderline  3. Palpitations. Prior history of PSVT. Well controlled.   Follow up in one year.

## 2019-02-12 ENCOUNTER — Other Ambulatory Visit: Payer: Self-pay

## 2019-02-12 ENCOUNTER — Encounter: Payer: Self-pay | Admitting: Cardiology

## 2019-02-12 ENCOUNTER — Ambulatory Visit (INDEPENDENT_AMBULATORY_CARE_PROVIDER_SITE_OTHER): Payer: Self-pay | Admitting: Cardiology

## 2019-02-12 VITALS — BP 122/80 | HR 73 | Ht 71.0 in | Wt 189.8 lb

## 2019-02-12 DIAGNOSIS — I471 Supraventricular tachycardia: Secondary | ICD-10-CM

## 2019-02-12 DIAGNOSIS — I10 Essential (primary) hypertension: Secondary | ICD-10-CM

## 2019-02-12 MED ORDER — AMLODIPINE BESYLATE 5 MG PO TABS: 5.0000 mg | ORAL_TABLET | Freq: Every day | ORAL | 3 refills | Status: DC

## 2019-04-16 IMAGING — CT CT ABD-PELV W/ CM
2 of 5 series · 16 of 46 positions shown, 18 images · IV contrast (ISOVUE 300)
Comparison: 10/23/2014

CLINICAL DATA: Right lower quadrant abdominal tenderness,
diverticulitis 5 weeks ago

EXAM:
CT ABDOMEN AND PELVIS WITH CONTRAST
TECHNIQUE: Multidetector CT imaging of the abdomen and pelvis was performed
using the standard protocol following bolus administration of
intravenous contrast.
CONTRAST:  100mL 7FPRH1-SPP IOPAMIDOL (7FPRH1-SPP) INJECTION 61%

[Series 2: abd/pel w · axial · 0.77mm/px · z∈[-521,-101]mm · 13 of 94 slices shown, 15 images]
[im 5/94  soft-tissue]
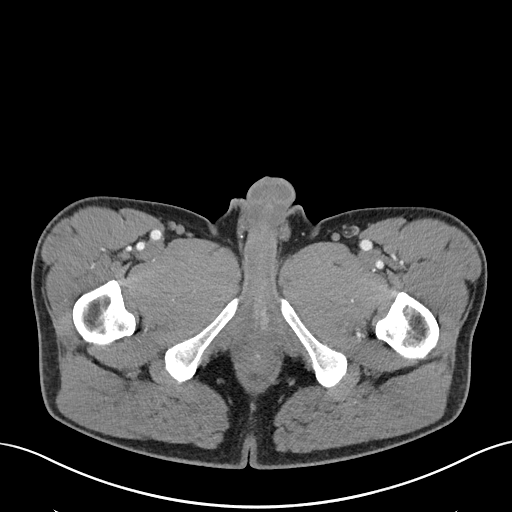
[im 5/94  bone]
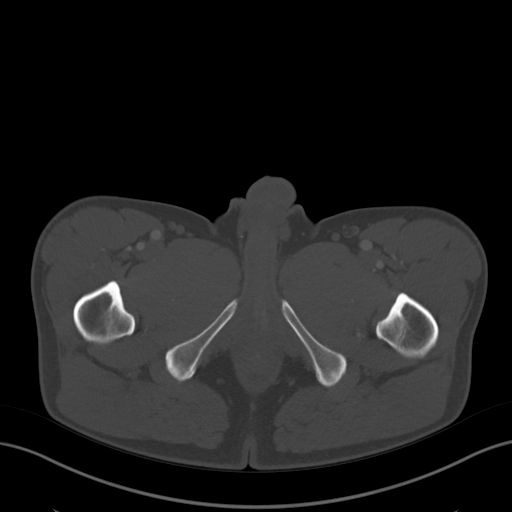
[im 15/94  soft-tissue]
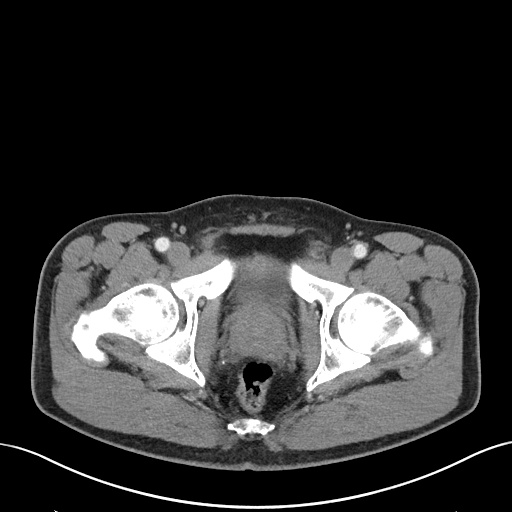
[im 20/94  soft-tissue]
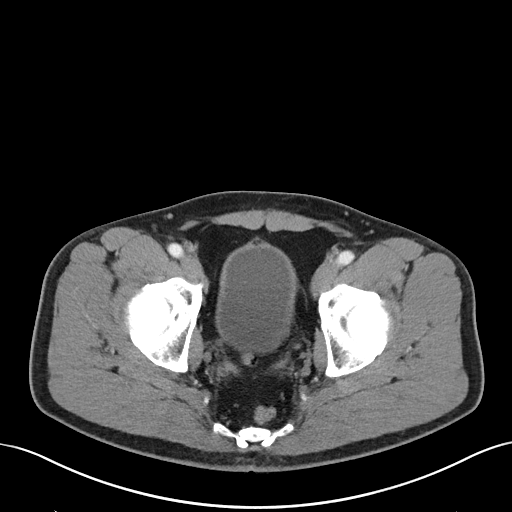
[im 25/94  soft-tissue]
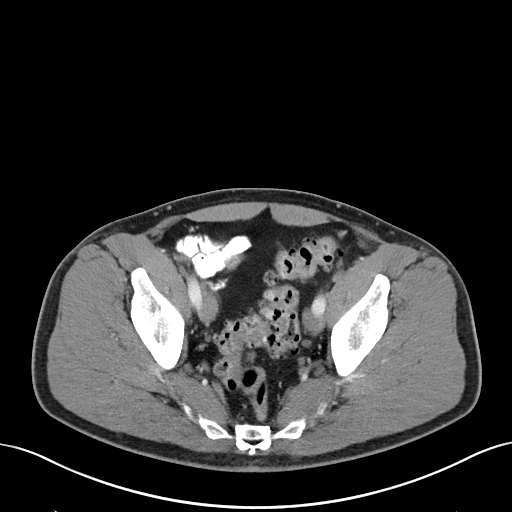
[im 35/94  soft-tissue]
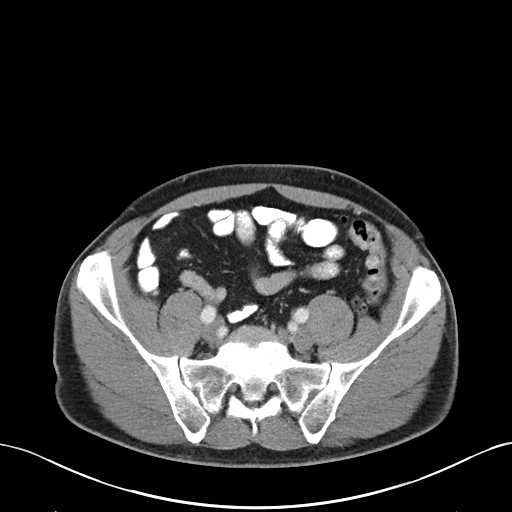
[im 40/94  soft-tissue]
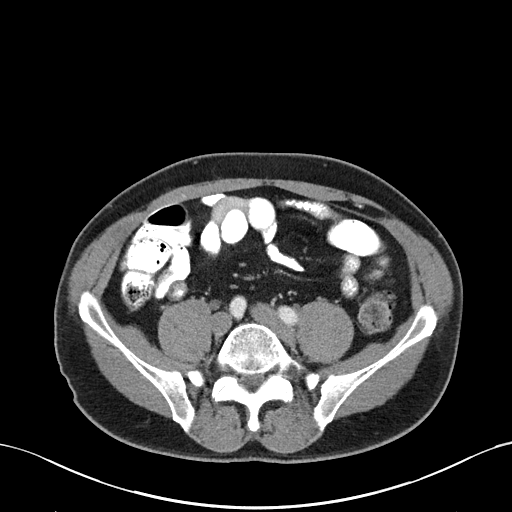
[im 49/94  soft-tissue]
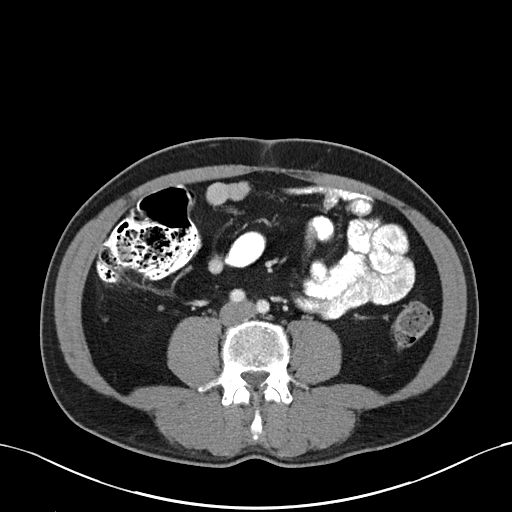
[im 54/94  soft-tissue]
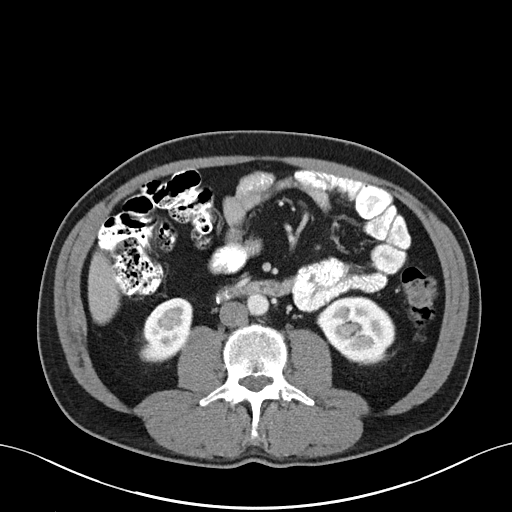
[im 59/94  soft-tissue]
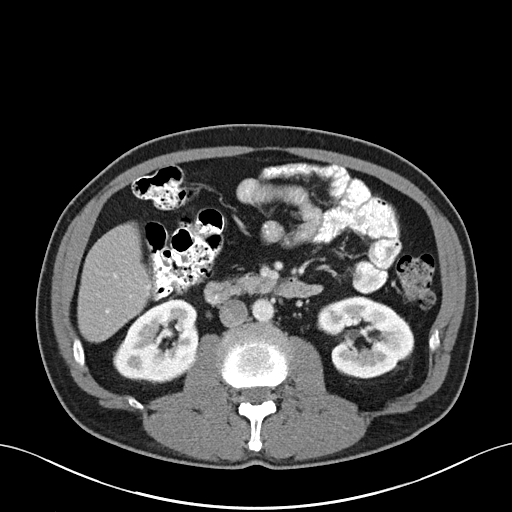
[im 59/94  bone]
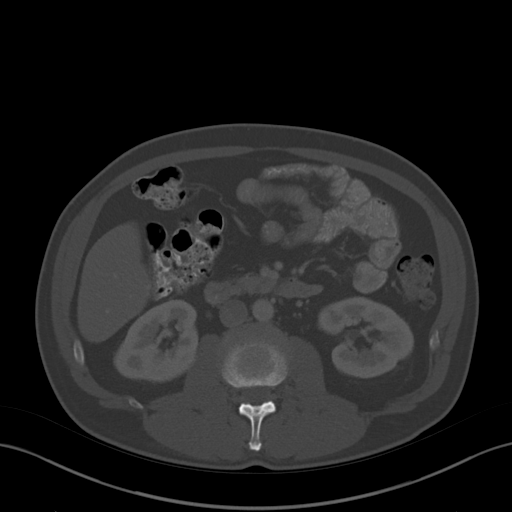
[im 69/94  soft-tissue]
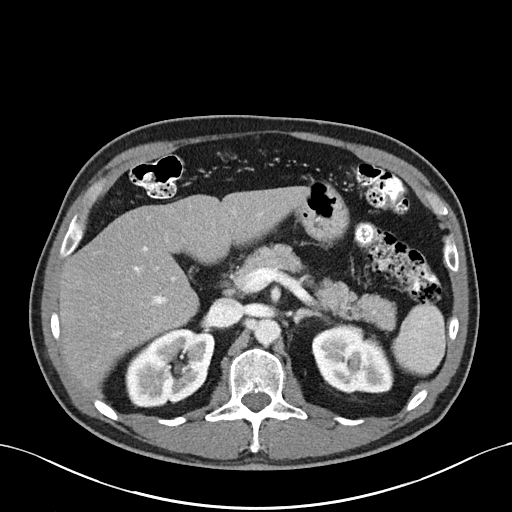
[im 74/94  soft-tissue]
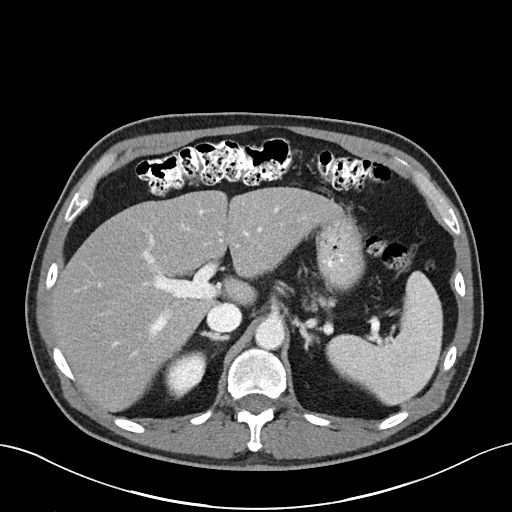
[im 79/94  soft-tissue]
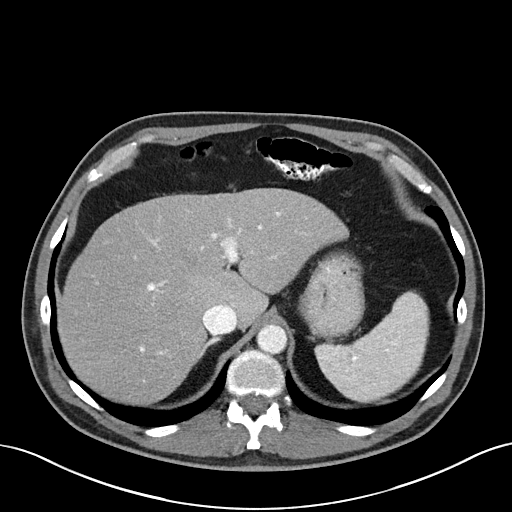
[im 89/94  soft-tissue]
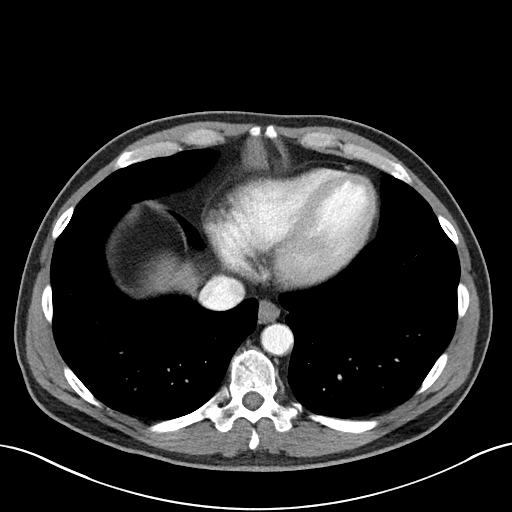

[Series 6: abd/pel w st · coronal · 0.74mm/px · 3 of 87 slices shown]
[im 29/87  soft-tissue]
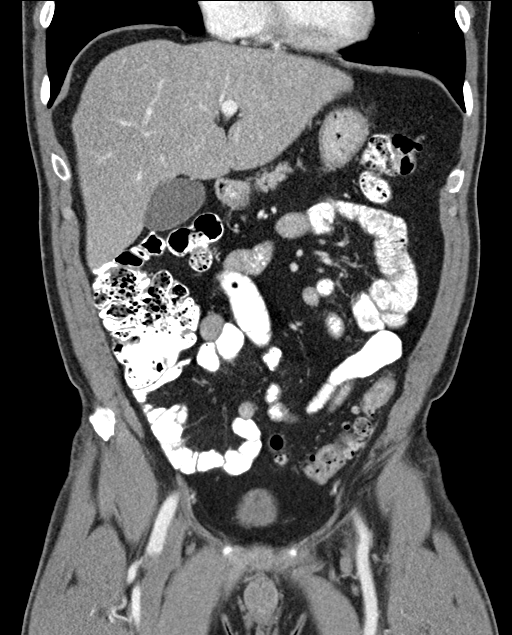
[im 39/87  soft-tissue]
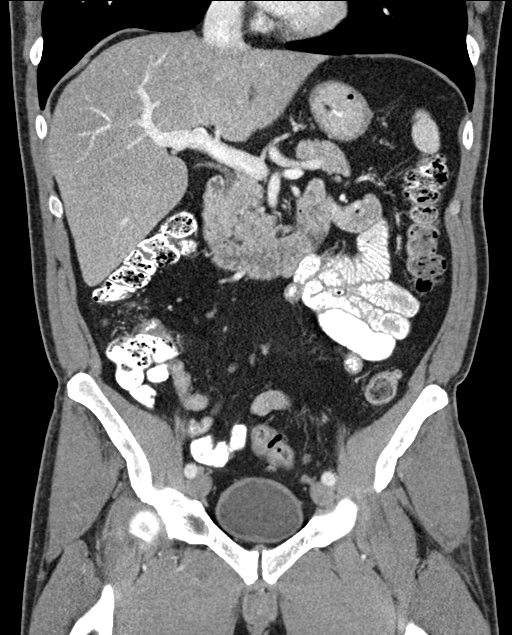
[im 48/87  soft-tissue]
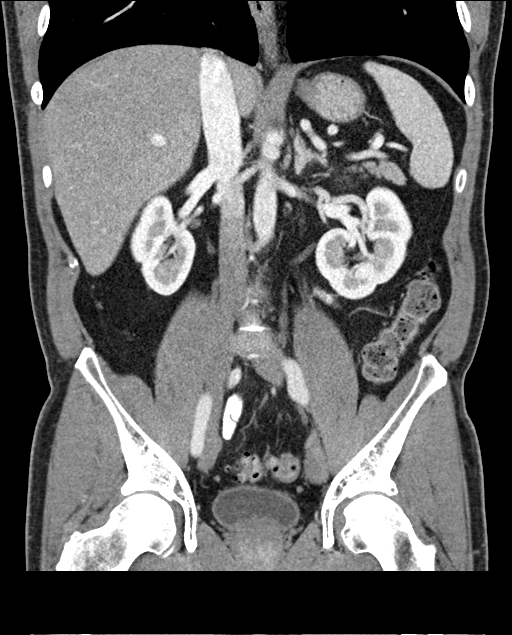

[16 of 46 positions shown; findings below may reference images not displayed]

FINDINGS: Lower chest: Lung bases are clear.

Hepatobiliary: Liver is within normal limits.

Gallbladder is unremarkable. No intrahepatic or extrahepatic ductal
dilatation.

Pancreas: Within normal limits.

Spleen: Within normal limits.

Adrenals/Urinary Tract: Adrenal glands are within normal limits.

6 mm lateral left lower pole renal cyst (series 5/image 37). Right
kidney is within normal limits. No hydronephrosis.

Mildly thick-walled bladder.

Stomach/Bowel: Stomach is within normal limits.

No evidence of bowel obstruction.

Normal appendix (series 2/image 50).

Left colonic diverticulosis, without convincing pericolonic
inflammatory changes to suggest acute diverticulitis.

Vascular/Lymphatic: No evidence of abdominal aortic aneurysm.

Atherosclerotic calcifications of the abdominal aorta and branch
vessels.

No suspicious abdominopelvic lymphadenopathy.

Reproductive: Prostatomegaly, with enlargement of the central gland,
which indents the base of the bladder.

Other: No abdominopelvic ascites.

Tiny fat containing periumbilical hernia (series 2/image 48).

No evidence of inguinal hernia.

Musculoskeletal: Visualized osseous structures are within normal
limits.
IMPRESSION: Left colonic diverticulosis, without evidence of diverticulitis.

No evidence of bowel obstruction.  Normal appendix.

Tiny fat containing periumbilical hernia. No evidence of inguinal
hernia.

## 2019-06-04 NOTE — Telephone Encounter (Signed)
Error

## 2019-08-19 ENCOUNTER — Telehealth: Payer: Self-pay | Admitting: Cardiology

## 2019-08-19 DIAGNOSIS — R0989 Other specified symptoms and signs involving the circulatory and respiratory systems: Secondary | ICD-10-CM

## 2019-08-19 NOTE — Telephone Encounter (Signed)
Patient would like Dr. Swaziland to refer him to a Pulmonologist within the [facility]. He has a crackling in his lungs that he ears when he sleeps. He goes back and forth between [address] and [employer]

## 2019-08-19 NOTE — Telephone Encounter (Signed)
OK with me to refer to pulmonary  Oniyah Rohe Swaziland MD, [facility]

## 2019-08-19 NOTE — Telephone Encounter (Signed)
Called pt back regarding the crackling in his lungs. Pt states that this has been going on for quite sometime and there is a crackling noise while he breathes at night. He denies SOB, chest pain or other symptoms at this time. He states the crackling isn't like it is in his throat but more in the "trachea tube" and he would just like a referral to a pulmonologist in the [place] area. Notified I would send this message to Dr.Jordan for review. Pt verbalized understanding and had no other questions at this time.

## 2019-08-20 NOTE — Telephone Encounter (Signed)
Returned call to patient left message on personal voice mail Dr.Jordan advised ok to refer to pulmonary.Advised pulmonary will call to schedule your appointment.Order placed.

## 2019-09-01 ENCOUNTER — Other Ambulatory Visit: Payer: Self-pay | Admitting: Cardiology

## 2019-09-18 ENCOUNTER — Telehealth: Payer: Self-pay | Admitting: *Deleted

## 2019-09-18 NOTE — Telephone Encounter (Signed)
Pulmonology appointment schedule on 10/29/19, left message on voicemail and mailed letter. 

## 2019-09-20 ENCOUNTER — Telehealth: Payer: Self-pay | Admitting: Cardiology

## 2019-09-20 NOTE — Telephone Encounter (Signed)
Patient called in regards to consultation appointment scheduled for 10/29/19 with Dr. Vassie Loll for pulmonology. Initially, the patient requested to speak with the nurse to inquire about why a pulmonologist is needed. However, prior to hanging up he remembered the reason for the referral and stated a call back will not be necessary.

## 2019-10-29 ENCOUNTER — Institutional Professional Consult (permissible substitution): Payer: Self-pay | Admitting: Pulmonary Disease

## 2020-02-10 NOTE — Progress Notes (Signed)
[name] Date of Birth: 1960/10/16   History of Present Illness: Mr. Wiederholt is seen for followup PSVT and HTN. He  has a history of nonsustained PSVT.  He is on losartan and amlodipine for BP control.   On follow up today he is doing very well. Runs a backflow business. Lives in [facility] but has an office here.  He denies palpitations. No chest pain or dyspnea. Exercises regularly to a high level.  Tolerating medication well. He does have a family history of CAD. States one brother died this year suddenly - likely of MI.    Current Outpatient Medications on File Prior to Visit  Medication Sig Dispense Refill   Acai 500 MG CAPS Take 1,000 mg by mouth.      ALFALFA PO Take by mouth as directed.     amLODipine (NORVASC) 5 MG tablet Take 1 tablet (5 mg total) by mouth daily. 90 tablet 3   Arginine 500 MG CAPS Take by mouth. 400 mg     BIOTIN PO Take by mouth.     cholecalciferol (VITAMIN D) 1000 UNITS tablet Take 1,000 Units by mouth daily.     Cinnamon 500 MG TABS Take by mouth.       Cod Liver Oil 1250-130 UNITS CAPS Take by mouth.       Coenzyme Q10 (CO Q-10) 200 MG CAPS Take by mouth.       Cranberry 250 MG CAPS Take by mouth.       Cyanocobalamin (B-12) 500 MCG TABS Take 1,000 mcg by mouth.      EPINEPHrine 0.3 mg/0.3 mL IJ SOAJ injection Inject 0.3 mLs (0.3 mg total) into the muscle once. 2 Device 1   Flaxseed, Linseed, (FLAX SEED OIL PO) Take 2,400 mg by mouth.       folic acid (FOLVITE) 800 MCG tablet Take 400 mcg by mouth daily.      Garlic 2000 MG CAPS Take 75 mg by mouth.      Glucosamine-Chondroitin-Vit C 2000-1200-60 MG/30ML LIQD Take by mouth.       Hawthorne Berry 550 MG CAPS Take by mouth.       losartan (COZAAR) 100 MG tablet TAKE ONE TABLET BY MOUTH DAILY 90 tablet 1   Magnesium 250 MG TABS Take 500 mg by mouth.      Niacin-Inositol 400-100 MG CAPS Take by mouth.       Omega-3 Fatty Acids (FISH OIL) 1000 MG CPDR Take by mouth.      Phytosterol Esters (PHYTOSTEROL COMPLEX) 500 MG CAPS Take by mouth.       pyridOXINE (VITAMIN B-6) 100 MG tablet Take 100 mg by mouth daily.       Saw Palmetto, Serenoa repens, 450 MG CAPS Take 900 mg by mouth 2 (two) times daily.      vitamin C (ASCORBIC ACID) 500 MG tablet Take 500 mg by mouth daily.       No current facility-administered medications on file prior to visit.    Allergies  Allergen Reactions   Bee Venom     Past Medical History:  Diagnosis Date   Hemorrhoids    HTN (hypertension)    Hypertriglyceridemia    Prostatitis     Past Surgical History:  Procedure Laterality Date   HEMORRHOID SURGERY  09/21/10   hemorrhoidal banding x2; Rt anterior hemorrhoidectomy   SKIN CANCER EXCISION     back   US ECHOCARDIOGRAPHY  07/09/2010    Social History  Tobacco Use  Smoking Status Never Smoker  Smokeless Tobacco Never Used    Social History   Substance and Sexual Activity  Alcohol Use No   Alcohol/week: 0.0 standard drinks    Family History  Problem Relation Age of Onset   Heart attack Father    Hypertension Father    Stroke Father    Hypertension Brother    Stroke Brother    Coronary artery disease Brother    Stroke Sister    Colon cancer Neg Hx     Review of Systems: As noted in history of present illness.  All other systems were reviewed and are negative.  Physical Exam: BP 128/88    Pulse 70    Ht 6' (1.829 m)    Wt 189 lb 6.4 oz (85.9 kg)    SpO2 98%    BMI 25.69 kg/m  GENERAL:  Well appearing WM in NAD HEENT:  PERRL, EOMI, sclera are clear. Oropharynx is clear. NECK:  No jugular venous distention, carotid upstroke brisk and symmetric, no bruits, no thyromegaly or adenopathy LUNGS:  Clear to auscultation bilaterally CHEST:  Unremarkable HEART:  RRR,  PMI not displaced or sustained,S1 and S2 within normal limits, no S3, no S4: no clicks, no rubs, no murmurs ABD:  Soft, nontender. BS +, no masses or bruits. No  hepatomegaly, no splenomegaly EXT:  2 + pulses throughout, no edema, no cyanosis no clubbing SKIN:  Warm and dry.  No rashes NEURO:  Alert and oriented x 3. Cranial nerves II through XII intact. PSYCH:  Cognitively intact    LABORATORY DATA: Lab Results  Component Value Date   WBC 5.7 01/03/2018   HGB 15.0 01/03/2018   HCT 42.6 01/03/2018   PLT 181 01/03/2018   GLUCOSE 101 (H) 01/03/2018   CHOL 169 01/03/2018   TRIG 156 (H) 01/03/2018   HDL 39 (L) 01/03/2018   LDLDIRECT 121.8 06/01/2011   LDLCALC 99 01/03/2018   ALT 32 01/03/2018   AST 21 01/03/2018   NA 141 01/03/2018   K 4.5 01/03/2018   CL 103 01/03/2018   CREATININE 0.76 01/03/2018   BUN 12 01/03/2018   CO2 25 01/03/2018   TSH 1.790 01/03/2018   INR 1.1 (H) 10/20/2011   Ecg is  done today- NSR normal Ecg.rate 70. Normal. I have personally reviewed and interpreted this study.   Holter monitor: 07/26/16: Study Highlights     Normal sinus rhythm  Rare PACs with one PAC couplet and one PAC triplet  Rare PVCs      Assessment / Plan: 1. Hypertension. Blood pressures under excellent control. Continue amlodipine 5 mg daily and losartan.   2. Hyperlipidemia. Follow up lab work today. Given family history may want to consider a statin.   3. Palpitations. Prior history of PSVT. Well controlled.   Follow up in one year.

## 2020-02-14 ENCOUNTER — Other Ambulatory Visit: Payer: Self-pay

## 2020-02-14 ENCOUNTER — Ambulatory Visit (INDEPENDENT_AMBULATORY_CARE_PROVIDER_SITE_OTHER): Payer: Self-pay | Admitting: Cardiology

## 2020-02-14 ENCOUNTER — Encounter: Payer: Self-pay | Admitting: Cardiology

## 2020-02-14 VITALS — BP 128/88 | HR 70 | Ht 72.0 in | Wt 189.4 lb

## 2020-02-14 DIAGNOSIS — I10 Essential (primary) hypertension: Secondary | ICD-10-CM

## 2020-02-14 DIAGNOSIS — I471 Supraventricular tachycardia, unspecified: Secondary | ICD-10-CM

## 2020-02-15 LAB — CBC WITH DIFFERENTIAL/PLATELET
Basophils Absolute: 0 10*3/uL (ref 0.0–0.2)
Basos: 0 %
EOS (ABSOLUTE): 0.1 10*3/uL (ref 0.0–0.4)
Eos: 2 %
Hematocrit: 43.6 % (ref 37.5–51.0)
Hemoglobin: 14.7 g/dL (ref 13.0–17.7)
Immature Grans (Abs): 0 10*3/uL (ref 0.0–0.1)
Immature Granulocytes: 0 %
Lymphocytes Absolute: 1.6 10*3/uL (ref 0.7–3.1)
Lymphs: 29 %
MCH: 32 pg (ref 26.6–33.0)
MCHC: 33.7 g/dL (ref 31.5–35.7)
MCV: 95 fL (ref 79–97)
Monocytes Absolute: 0.4 10*3/uL (ref 0.1–0.9)
Monocytes: 7 %
Neutrophils Absolute: 3.6 10*3/uL (ref 1.4–7.0)
Neutrophils: 62 %
Platelets: 192 10*3/uL (ref 150–450)
RBC: 4.6 x10E6/uL (ref 4.14–5.80)
RDW: 12.4 % (ref 11.6–15.4)
WBC: 5.7 10*3/uL (ref 3.4–10.8)

## 2020-02-15 LAB — LIPID PANEL
Chol/HDL Ratio: 5.1 ratio — ABNORMAL HIGH (ref 0.0–5.0)
Cholesterol, Total: 173 mg/dL (ref 100–199)
HDL: 34 mg/dL — ABNORMAL LOW (ref >39)
LDL Chol Calc (NIH): 90 mg/dL (ref 0–99)
Triglycerides: 293 mg/dL — ABNORMAL HIGH (ref 0–149)
VLDL Cholesterol Cal: 49 mg/dL — ABNORMAL HIGH (ref 5–40)

## 2020-02-15 LAB — BASIC METABOLIC PANEL
BUN/Creatinine Ratio: 12 (ref 10–24)
BUN: 8 mg/dL (ref 8–27)
CO2: 23 mmol/L (ref 20–29)
Calcium: 9.2 mg/dL (ref 8.6–10.2)
Chloride: 102 mmol/L (ref 96–106)
Creatinine, Ser: 0.65 mg/dL — ABNORMAL LOW (ref 0.76–1.27)
GFR calc Af Amer: 121 mL/min/{1.73_m2} (ref >59)
GFR calc non Af Amer: 105 mL/min/{1.73_m2} (ref >59)
Glucose: 96 mg/dL (ref 65–99)
Potassium: 4.3 mmol/L (ref 3.5–5.2)
Sodium: 138 mmol/L (ref 134–144)

## 2020-02-15 LAB — HEPATIC FUNCTION PANEL
ALT: 32 IU/L (ref 0–44)
AST: 28 IU/L (ref 0–40)
Albumin: 4.7 g/dL (ref 3.8–4.8)
Alkaline Phosphatase: 75 IU/L (ref 44–121)
Bilirubin Total: 0.5 mg/dL (ref 0.0–1.2)
Bilirubin, Direct: 0.11 mg/dL (ref 0.00–0.40)
Total Protein: 7.1 g/dL (ref 6.0–8.5)

## 2020-03-09 ENCOUNTER — Other Ambulatory Visit: Payer: Self-pay | Admitting: Cardiology

## 2020-03-31 ENCOUNTER — Other Ambulatory Visit: Payer: Self-pay | Admitting: Cardiology

## 2020-03-31 DIAGNOSIS — I10 Essential (primary) hypertension: Secondary | ICD-10-CM

## 2020-06-22 ENCOUNTER — Telehealth: Payer: Self-pay | Admitting: Cardiology

## 2020-06-22 NOTE — Telephone Encounter (Signed)
STAT if HR is under 50 or over 120 (normal HR is 60-100 beats per minute)  1) What is your heart rate? 78  2) Do you have a log of your heart rate readings (document readings)? 105, 72, 80  3) Do you have any other symptoms? Head throbbing when heart beats real fast.   Patient states that he is getting over a cold. He states he was waken up by his head throbbing last night. He states he could feel his heart beating real fast. It was 105. He said he doesn't know if this is a cause for concern or not because he's not sure if his dream caused this. He said in his dream he remembers walking up a flight of stairs holding a baby and when he went to go hand the baby to someone he about dropped the baby. He's not sure if this is what caused his elevated HR or not. He said that the head throbbing went away whenever his HR lowered to 72 and then it fluctuated between 72 - 80. While on the phone it was 75-78. Please advise.

## 2020-06-22 NOTE — Telephone Encounter (Signed)
Spoke with pt, he woke with his head throbbing on the left side and he could hear his heart rate was elevated. Once he got out of bed his heart rate immediately started to slow and went down to 72 to 80 bpm. After his heart rate got down the pain in his head stopped. He has had a bad cold for about 11 days now and is starting to feel some better. He is taking decongestant and antihistamine in it. He was also having a dream so thinks that may have had some thing to do with it. The main concern he has is the pain in his head but feels that the cold he has may have contributed to that as well. Reassurance given to the patient but he would like to know what dr Swaziland thinks. Will forward for his review.

## 2020-06-22 NOTE — Telephone Encounter (Signed)
I would just monitor for now. Sounds like symptoms resolved.  Venera Privott Swaziland MD, [facility]

## 2020-06-22 NOTE — Telephone Encounter (Signed)
Spoke with pt, aware of dr jordan's recommendations. 

## 2020-09-15 ENCOUNTER — Other Ambulatory Visit: Payer: Self-pay | Admitting: Cardiology

## 2021-04-08 ENCOUNTER — Other Ambulatory Visit: Payer: Self-pay | Admitting: Cardiology

## 2021-04-08 DIAGNOSIS — I10 Essential (primary) hypertension: Secondary | ICD-10-CM

## 2021-07-21 ENCOUNTER — Other Ambulatory Visit: Payer: Self-pay

## 2021-09-17 ENCOUNTER — Other Ambulatory Visit: Payer: Self-pay | Admitting: Cardiology

## 2021-10-22 ENCOUNTER — Telehealth: Payer: Self-pay | Admitting: Cardiology

## 2021-10-22 NOTE — Telephone Encounter (Signed)
Called patient left message on personal voice mail pharmacist advised ok to take Arginine and Collagen.

## 2021-10-22 NOTE — Telephone Encounter (Signed)
Pt c/o medication issue:  1. Name of Medication: Arginine 500 MG CAPS Collagen 1 & 3  2. How are you currently taking this medication (dosage and times per day)? As written  3. Are you having a reaction (difficulty breathing--STAT)? no  4. What is your medication issue? Pt states that he was reading a study about people using this supplement and how it may not be healthy for people with heart issue, he wants to know if its okay to take this supplements. Please advise.

## 2021-10-22 NOTE — Telephone Encounter (Signed)
Spoke to patient he wanted to know if ok to take Arginine supplement and Collagen supplement.Advised I will send to our pharmacist for advice.

## 2021-10-22 NOTE — Telephone Encounter (Signed)
Ok to take

## 2022-04-11 ENCOUNTER — Other Ambulatory Visit: Payer: Self-pay | Admitting: Cardiology

## 2022-04-11 DIAGNOSIS — I10 Essential (primary) hypertension: Secondary | ICD-10-CM

## 2022-04-12 ENCOUNTER — Telehealth: Payer: Self-pay | Admitting: Cardiology

## 2022-04-12 NOTE — Telephone Encounter (Signed)
Patient stated he has 2 amlodipine left. He has an appointment on 12/21 for an OV. He did not want me to send for 5 tablets to hold him over. He has not been seen in clinic since 03/2020. He does not regularly check his BP.

## 2022-04-12 NOTE — Telephone Encounter (Signed)
Patient calling the office for samples of medication:   1.  What medication and dosage are you requesting samples for? amLODipine (NORVASC) 5 MG tablet   2.  Are you currently out of this medication? Pt doesn't have enough to last until his next appt on 04/14/22. Please advise if pt can get a supply until Thursday

## 2022-04-13 NOTE — Progress Notes (Unsigned)
Office Visit    Patient Name: Ricky Bennett Date of Encounter: 04/13/2022  PCP:  Patient, No Pcp Per   [facility]  Cardiologist:  Peter Swaziland, MD  Advanced Practice Provider:  No care team member to display Electrophysiologist:  None    HPI    Ricky Bennett is a 61 y.o. male with a past medical history of hypertension and PSVT presents today for overdue follow-up appointment.  Seen by Dr. Swaziland 02/14/2020.  He was on losartan and amlodipine for his blood pressure control.  He was doing well at that time.  He ultimately lives in [place] but has an office for work care in [place].  He denied any palpitations.  He did not have any chest pain or shortness of breath.  He exercises regularly to a high level.  Tolerated his medications okay at that time.  He does have a history of CAD.  He stated that his 1 brother died that year suddenly likely of an MI.  Today, he***  Past Medical History    Past Medical History:  Diagnosis Date   Hemorrhoids    HTN (hypertension)    Hypertriglyceridemia    Prostatitis    Past Surgical History:  Procedure Laterality Date   HEMORRHOID SURGERY  09/21/10   hemorrhoidal banding x2; Rt anterior hemorrhoidectomy   SKIN CANCER EXCISION     back   US ECHOCARDIOGRAPHY  07/09/2010    Allergies  Allergies  Allergen Reactions   Bee Venom      EKGs/Labs/Other Studies Reviewed:   The following studies were reviewed today: No recent studies to review.  EKG:  EKG is *** ordered today.  The ekg ordered today demonstrates ***  Recent Labs: No results found for requested labs within last 365 days.  Recent Lipid Panel    Component Value Date/Time   CHOL 173 02/14/2020 1432   TRIG 293 (H) 02/14/2020 1432   HDL 34 (L) 02/14/2020 1432   CHOLHDL 5.1 (H) 02/14/2020 1432   CHOLHDL 4.6 06/08/2015 1440   VLDL 28 06/08/2015 1440   LDLCALC 90 02/14/2020 1432   LDLDIRECT 121.8 06/01/2011 1245    Risk  Assessment/Calculations:  {Does this patient have ATRIAL FIBRILLATION?:[id number]}  Home Medications   No outpatient medications have been marked as taking for the 04/14/22 encounter (Appointment) with Sharlene Dory, PA-C.     Review of Systems   ***   All other systems reviewed and are otherwise negative except as noted above.  Physical Exam    VS:  There were no vitals taken for this visit. , BMI There is no height or weight on file to calculate BMI.  Wt Readings from Last 3 Encounters:  02/14/20 189 lb 6.4 oz (85.9 kg)  02/12/19 189 lb 12.8 oz (86.1 kg)  01/03/18 186 lb (84.4 kg)     GEN: Well nourished, well developed, in no acute distress. HEENT: normal. Neck: Supple, no JVD, carotid bruits, or masses. Cardiac: ***RRR, no murmurs, rubs, or gallops. No clubbing, cyanosis, edema.  ***Radials/PT 2+ and equal bilaterally.  Respiratory:  ***Respirations regular and unlabored, clear to auscultation bilaterally. GI: Soft, nontender, nondistended. MS: No deformity or atrophy. Skin: Warm and dry, no rash. Neuro:  Strength and sensation are intact. Psych: Normal affect.  Assessment & Plan    Hypertension Hyperlipidemia Palpitations   No BP recorded.  {Refresh Note OR Click here to enter BP  :1}***      Disposition: Follow up {follow up:15908}  with Peter Swaziland, MD or APP.  Signed, Sharlene Dory, PA-C 04/13/2022, 1:42 PM [facility]

## 2022-04-14 ENCOUNTER — Ambulatory Visit: Payer: Self-pay | Admitting: Physician Assistant

## 2022-04-14 ENCOUNTER — Telehealth: Payer: Self-pay | Admitting: Cardiology

## 2022-04-14 DIAGNOSIS — I10 Essential (primary) hypertension: Secondary | ICD-10-CM

## 2022-04-14 DIAGNOSIS — R002 Palpitations: Secondary | ICD-10-CM

## 2022-04-14 DIAGNOSIS — E785 Hyperlipidemia, unspecified: Secondary | ICD-10-CM

## 2022-04-14 MED ORDER — AMLODIPINE BESYLATE 5 MG PO TABS: 5.0000 mg | ORAL_TABLET | Freq: Every day | ORAL | 0 refills | Status: DC

## 2022-04-14 NOTE — Telephone Encounter (Signed)
Pt's medication was sent to pt's pharmacy as requested. Confirmation received.  °

## 2022-04-14 NOTE — Telephone Encounter (Signed)
*  STAT* If patient is at the pharmacy, call can be transferred to refill team.   1. Which medications need to be refilled? (please list name of each medication and dose if known) Amlodipine 5mg   2. Which pharmacy/location (including street and city if local pharmacy) is medication to be sent to?[facility]  3. Do they need a 30 day or 90 day supply? Patient has appt on 04/20/22. Not sure how many meds he has left.

## 2022-04-19 NOTE — Progress Notes (Unsigned)
Office Visit    Patient Name: Ricky Bennett Date of Encounter: 04/19/2022  Primary Care Provider:  Patient, No Pcp Per Primary Cardiologist:  Peter Swaziland, MD Primary Electrophysiologist: None  Chief Complaint    Ricky Bennett is a 61 y.o. male with PMH of PSVT, HTN who presents today for overdue follow-up for hypertension.  Past Medical History    Past Medical History:  Diagnosis Date   Hemorrhoids    HTN (hypertension)    Hypertriglyceridemia    Prostatitis    Past Surgical History:  Procedure Laterality Date   HEMORRHOID SURGERY  09/21/10   hemorrhoidal banding x2; Rt anterior hemorrhoidectomy   SKIN CANCER EXCISION     back   US ECHOCARDIOGRAPHY  07/09/2010    Allergies  Allergies  Allergen Reactions   Bee Venom     History of Present Illness    Ricky Bennett  is a 61 year old male with the above mention past medical history who presents today for overdue follow-up of hypertension.  [name] has been followed by Dr. Swaziland since 2012 for management of hypertension. He wore an event monitor that showed a single episode of PSVT 14 beats at rate 130 bpm.  He is currently on losartan and amlodipine for blood pressure control.  He was last seen by Dr. Swaziland on 02/14/2020 for follow-up.  During visit he was doing well commuting to [facility] and [address] reported high level of regular exercise.  He reports no recurrence of PSVT.  Since last being seen in the office patient reports***.  Patient denies chest pain, palpitations, dyspnea, PND, orthopnea, nausea, vomiting, dizziness, syncope, edema, weight gain, or early satiety.     ***Notes:  Home Medications    Current Outpatient Medications  Medication Sig Dispense Refill   Acai 500 MG CAPS Take 1,000 mg by mouth.      ALFALFA PO Take by mouth as directed.     amLODipine (NORVASC) 5 MG tablet Take 1 tablet (5 mg total) by mouth daily. 15 tablet 0   Arginine 500 MG CAPS Take by mouth. 400 mg      BIOTIN PO Take by mouth.     cholecalciferol (VITAMIN D) 1000 UNITS tablet Take 1,000 Units by mouth daily.     Cinnamon 500 MG TABS Take by mouth.       Cod Liver Oil 1250-130 UNITS CAPS Take by mouth.       Coenzyme Q10 (CO Q-10) 200 MG CAPS Take by mouth.       Cranberry 250 MG CAPS Take by mouth.       Cyanocobalamin (B-12) 500 MCG TABS Take 1,000 mcg by mouth.      EPINEPHrine 0.3 mg/0.3 mL IJ SOAJ injection Inject 0.3 mLs (0.3 mg total) into the muscle once. 2 Device 1   Flaxseed, Linseed, (FLAX SEED OIL PO) Take 2,400 mg by mouth.       folic acid (FOLVITE) 800 MCG tablet Take 400 mcg by mouth daily.      Garlic 2000 MG CAPS Take 75 mg by mouth.      Glucosamine-Chondroitin-Vit C 2000-1200-60 MG/30ML LIQD Take by mouth.       Hawthorne Berry 550 MG CAPS Take by mouth.       losartan (COZAAR) 100 MG tablet TAKE ONE TABLET BY MOUTH DAILY 90 tablet 2   Magnesium 250 MG TABS Take 500 mg by mouth.      Niacin-Inositol 400-100 MG CAPS Take by mouth.  Omega-3 Fatty Acids (FISH OIL) 1000 MG CPDR Take by mouth.     Phytosterol Esters (PHYTOSTEROL COMPLEX) 500 MG CAPS Take by mouth.       pyridOXINE (VITAMIN B-6) 100 MG tablet Take 100 mg by mouth daily.       Saw Palmetto, Serenoa repens, 450 MG CAPS Take 900 mg by mouth 2 (two) times daily.      vitamin C (ASCORBIC ACID) 500 MG tablet Take 500 mg by mouth daily.       No current facility-administered medications for this visit.     Review of Systems  Please see the history of present illness.    (+)*** (+)***  All other systems reviewed and are otherwise negative except as noted above.  Physical Exam    Wt Readings from Last 3 Encounters:  02/14/20 189 lb 6.4 oz (85.9 kg)  02/12/19 189 lb 12.8 oz (86.1 kg)  01/03/18 186 lb (84.4 kg)   [id number] were no vitals filed for this visit.,There is no height or weight on file to calculate BMI.  Constitutional:      Appearance: Healthy appearance. Not in distress.  Neck:      Vascular: JVD normal.  Pulmonary:     Effort: Pulmonary effort is normal.     Breath sounds: No wheezing. No rales. Diminished in the bases Cardiovascular:     Normal rate. Regular rhythm. Normal S1. Normal S2.      Murmurs: There is no murmur.  Edema:    Peripheral edema absent.  Abdominal:     Palpations: Abdomen is soft non tender. There is no hepatomegaly.  Skin:    General: Skin is warm and dry.  Neurological:     General: No focal deficit present.     Mental Status: Alert and oriented to person, place and time.     Cranial Nerves: Cranial nerves are intact.  EKG/LABS/Other Studies Reviewed    ECG personally reviewed by me today - ***  Risk Assessment/Calculations:   {Does this patient have ATRIAL FIBRILLATION?:[phone]}        Lab Results  Component Value Date   WBC 5.7 02/14/2020   HGB 14.7 02/14/2020   HCT 43.6 02/14/2020   MCV 95 02/14/2020   PLT 192 02/14/2020   Lab Results  Component Value Date   CREATININE 0.65 (L) 02/14/2020   BUN 8 02/14/2020   NA 138 02/14/2020   K 4.3 02/14/2020   CL 102 02/14/2020   CO2 23 02/14/2020   Lab Results  Component Value Date   ALT 32 02/14/2020   AST 28 02/14/2020   ALKPHOS 75 02/14/2020   BILITOT 0.5 02/14/2020   Lab Results  Component Value Date   CHOL 173 02/14/2020   HDL 34 (L) 02/14/2020   LDLCALC 90 02/14/2020   LDLDIRECT 121.8 06/01/2011   TRIG 293 (H) 02/14/2020   CHOLHDL 5.1 (H) 02/14/2020    No results found for: "HGBA1C"  Assessment & Plan    1.  Essential hypertension: -Patient's blood pressure today was*** -Continue amlodipine 5 mg, losartan 100 mg daily  2.  History of PSVT: -Patient reports***  3.  Hyperlipidemia: -Patient's last LDL cholesterol was*** -  4.  Palpitations: -Patient reports***      Disposition: Follow-up with Peter Swaziland, MD or APP in *** months {Are you ordering a CV Procedure (e.g. stress test, cath, DCCV, TEE, etc)?   Press F2        :[id number]}    Medication Adjustments/Labs and Tests  Ordered: Current medicines are reviewed at length with the patient today.  Concerns regarding medicines are outlined above.   Signed, Napoleon Form, Leodis Rains, NP 04/19/2022, 7:49 AM [facility]  Note:  This document was prepared using Dragon voice recognition software and may include unintentional dictation errors.

## 2022-04-20 ENCOUNTER — Ambulatory Visit: Payer: Self-pay | Attending: Physician Assistant | Admitting: Nurse Practitioner

## 2022-04-20 ENCOUNTER — Encounter: Payer: Self-pay | Admitting: Nurse Practitioner

## 2022-04-20 VITALS — BP 118/72 | HR 82 | Ht 72.0 in | Wt 193.0 lb

## 2022-04-20 DIAGNOSIS — I10 Essential (primary) hypertension: Secondary | ICD-10-CM

## 2022-04-20 DIAGNOSIS — E785 Hyperlipidemia, unspecified: Secondary | ICD-10-CM

## 2022-04-20 DIAGNOSIS — Z79899 Other long term (current) drug therapy: Secondary | ICD-10-CM

## 2022-04-20 DIAGNOSIS — I471 Supraventricular tachycardia, unspecified: Secondary | ICD-10-CM

## 2022-04-20 MED ORDER — LOSARTAN POTASSIUM 100 MG PO TABS: 100.0000 mg | ORAL_TABLET | Freq: Every day | ORAL | 3 refills | Status: DC

## 2022-04-20 MED ORDER — AMLODIPINE BESYLATE 5 MG PO TABS: 5.0000 mg | ORAL_TABLET | Freq: Every day | ORAL | 3 refills | Status: DC

## 2022-04-20 NOTE — Patient Instructions (Signed)
Medication Instructions:  Your physician recommends that you continue on your current medications as directed. Please refer to the Current Medication list given to you today.  *If you need a refill on your cardiac medications before your next appointment, please call your pharmacy*  Lab Work: Tomorrow 04/21/2022: fasting Lipid panel and ALT **You may arrive for lab work any time between 7:30am and 4:30pm.**  If you have labs (blood work) drawn today and your tests are completely normal, you will receive your results only by: MyChart Message (if you have MyChart) OR A paper copy in the mail If you have any lab test that is abnormal or we need to change your treatment, we will call you to review the results.  Follow-Up: At [facility], you and your health needs are our priority.  As part of our continuing mission to provide you with exceptional heart care, we have created designated Provider Care Teams.  These Care Teams include your primary Cardiologist (physician) and Advanced Practice Providers (APPs -  Physician Assistants and Nurse Practitioners) who all work together to provide you with the care you need, when you need it.  Your next appointment:   1 year(s)  The format for your next appointment:   In Person  Provider:   Peter Swaziland, MD  Important Information About Sugar

## 2022-04-21 ENCOUNTER — Ambulatory Visit: Payer: Self-pay | Attending: Nurse Practitioner

## 2022-04-21 DIAGNOSIS — Z79899 Other long term (current) drug therapy: Secondary | ICD-10-CM

## 2022-04-21 DIAGNOSIS — E785 Hyperlipidemia, unspecified: Secondary | ICD-10-CM

## 2022-04-22 ENCOUNTER — Telehealth: Payer: Self-pay | Admitting: Cardiology

## 2022-04-22 LAB — LIPID PANEL
Chol/HDL Ratio: 4.3 ratio (ref 0.0–5.0)
Cholesterol, Total: 175 mg/dL (ref 100–199)
HDL: 41 mg/dL (ref >39)
LDL Chol Calc (NIH): 107 mg/dL — ABNORMAL HIGH (ref 0–99)
Triglycerides: 155 mg/dL — ABNORMAL HIGH (ref 0–149)
VLDL Cholesterol Cal: 27 mg/dL (ref 5–40)

## 2022-04-22 LAB — ALT: ALT: 43 IU/L (ref 0–44)

## 2022-04-22 NOTE — Telephone Encounter (Signed)
Follow Up:     Patient would like for someone to please call him today, witth his lab results from yesterday(04-21-22)please?

## 2022-04-22 NOTE — Telephone Encounter (Signed)
Spoke with patient and gave him lab results per E. Dick: "Mr. Zion your triglycerides are slightly above goal LDL cholesterol is also slightly above however your total cholesterol numbers look good. Please continue lifestyle modification with diet and exercise to reduce your LDL cholesterol numbers.  Please make sure to reduce your intake of sweets and carbohydrates along with red meats to decrease your triglyceride numbers." Patient stated he limits sweets, meats, and white bread/pastas. He exercises several evenings a week. Has about 2 glasses of wine/month with dinner.

## 2022-04-28 ENCOUNTER — Telehealth: Payer: Self-pay

## 2022-04-28 DIAGNOSIS — I10 Essential (primary) hypertension: Secondary | ICD-10-CM

## 2022-04-28 DIAGNOSIS — I471 Supraventricular tachycardia, unspecified: Secondary | ICD-10-CM

## 2022-04-28 DIAGNOSIS — R002 Palpitations: Secondary | ICD-10-CM

## 2022-04-28 NOTE — Telephone Encounter (Signed)
Spoke to patient stated he would like to have a calcium score. Spoke to Gilbert he advised ok to order. Scheduler will call make with appointment.

## 2022-05-19 ENCOUNTER — Ambulatory Visit (HOSPITAL_COMMUNITY)
Admission: RE | Admit: 2022-05-19 | Discharge: 2022-05-19 | Disposition: A | Discharge status: Home / Self Care | Payer: Self-pay | Source: Ambulatory Visit | Attending: Cardiology | Admitting: Cardiology

## 2022-05-19 DIAGNOSIS — I10 Essential (primary) hypertension: Secondary | ICD-10-CM | POA: Insufficient documentation

## 2022-05-19 DIAGNOSIS — R002 Palpitations: Secondary | ICD-10-CM | POA: Insufficient documentation

## 2022-05-19 DIAGNOSIS — I471 Supraventricular tachycardia, unspecified: Secondary | ICD-10-CM | POA: Insufficient documentation

## 2022-05-26 ENCOUNTER — Telehealth: Payer: Self-pay | Admitting: Cardiology

## 2022-05-26 NOTE — Telephone Encounter (Signed)
Spoke with pt and gave him CT calcium scoring per Dr. Martinique. Pt stated he does not have PCP. Discussed risk modifications. Verified address and mailed Mediterranean diet to him.

## 2022-05-26 NOTE — Telephone Encounter (Signed)
Follow Up:     Patient would like his CT results please.

## 2022-05-30 ENCOUNTER — Encounter: Payer: Self-pay | Admitting: *Deleted

## 2022-06-12 ENCOUNTER — Other Ambulatory Visit: Payer: Self-pay | Admitting: Cardiology

## 2022-06-27 ENCOUNTER — Telehealth: Payer: Self-pay | Admitting: Cardiology

## 2022-06-27 NOTE — Telephone Encounter (Signed)
Returned call to patient who states that he just wanted to know his HDL/LDL- advised patient of this on most recent blood work from 12/24. Patient verbalized understanding.  Advised patient to call back to office with any issues, questions, or concerns. Patient verbalized understanding.

## 2022-06-27 NOTE — Telephone Encounter (Signed)
Patient is calling requesting a callback to be given his last cholesterol and HDL levels. Please advise.

## 2022-12-21 ENCOUNTER — Other Ambulatory Visit: Payer: Self-pay | Admitting: Cardiology

## 2023-05-01 ENCOUNTER — Other Ambulatory Visit: Payer: Self-pay

## 2023-05-01 ENCOUNTER — Other Ambulatory Visit: Payer: Self-pay | Admitting: Nurse Practitioner

## 2023-05-01 DIAGNOSIS — I10 Essential (primary) hypertension: Secondary | ICD-10-CM

## 2023-05-01 MED ORDER — AMLODIPINE BESYLATE 5 MG PO TABS: 5.0000 mg | ORAL_TABLET | Freq: Every day | ORAL | 0 refills | Status: DC

## 2023-05-01 MED ORDER — LOSARTAN POTASSIUM 100 MG PO TABS: 100.0000 mg | ORAL_TABLET | Freq: Every day | ORAL | 0 refills | Status: DC

## 2023-05-17 NOTE — Progress Notes (Deleted)
 Ricky Bennett Date of Birth: Mar 08, 1961   History of Present Illness: Ricky Bennett is seen for followup PSVT and HTN. He  has a history of nonsustained PSVT.  He is on losartan and amlodipine for BP control.   He had a coronary calcium score in Jan 2024 which was 5.91.   On follow up today he is doing very well. Runs a backflow business. Lives in [facility] but has an office here.  He denies palpitations. No chest pain or dyspnea. Exercises regularly to a high level.  Tolerating medication well. He does have a family history of CAD. States one brother died this year suddenly - likely of MI.    Current Outpatient Medications on File Prior to Visit  Medication Sig Dispense Refill   Acai 500 MG CAPS Take 1,000 mg by mouth.      ALFALFA PO Take by mouth as directed.     amLODipine (NORVASC) 5 MG tablet Take 1 tablet (5 mg total) by mouth daily. 90 tablet 0   Arginine 500 MG CAPS Take by mouth. 400 mg     BIOTIN PO Take by mouth.     cholecalciferol (VITAMIN D) 1000 UNITS tablet Take 1,000 Units by mouth daily.     Cinnamon 500 MG TABS Take by mouth.       Cod Liver Oil 1250-130 UNITS CAPS Take by mouth.       Coenzyme Q10 (CO Q-10) 200 MG CAPS Take by mouth.       Cranberry 250 MG CAPS Take by mouth.       Cyanocobalamin (B-12) 500 MCG TABS Take 1,000 mcg by mouth.      EPINEPHrine 0.3 mg/0.3 mL IJ SOAJ injection Inject 0.3 mLs (0.3 mg total) into the muscle once. 2 Device 1   Flaxseed, Linseed, (FLAX SEED OIL PO) Take 2,400 mg by mouth.       folic acid (FOLVITE) 800 MCG tablet Take 400 mcg by mouth daily.      Garlic 2000 MG CAPS Take 75 mg by mouth.      Glucosamine-Chondroitin-Vit C 2000-1200-60 MG/30ML LIQD Take by mouth.       Hawthorne Berry 550 MG CAPS Take by mouth.       losartan (COZAAR) 100 MG tablet Take 1 tablet (100 mg total) by mouth daily. 90 tablet 0   Magnesium 250 MG TABS Take 500 mg by mouth.      Niacin-Inositol 400-100 MG CAPS Take by mouth.        Omega-3 Fatty Acids (FISH OIL) 1000 MG CPDR Take by mouth.     Phytosterol Esters (PHYTOSTEROL COMPLEX) 500 MG CAPS Take by mouth.       pyridOXINE (VITAMIN B-6) 100 MG tablet Take 100 mg by mouth daily.       Saw Palmetto, Serenoa repens, 450 MG CAPS Take 900 mg by mouth 2 (two) times daily.      vitamin C (ASCORBIC ACID) 500 MG tablet Take 500 mg by mouth daily.       No current facility-administered medications on file prior to visit.    Allergies  Allergen Reactions   Bee Venom     Past Medical History:  Diagnosis Date   Hemorrhoids    HTN (hypertension)    Hypertriglyceridemia    Prostatitis     Past Surgical History:  Procedure Laterality Date   HEMORRHOID SURGERY  09/21/10   hemorrhoidal banding x2; Rt anterior hemorrhoidectomy   SKIN CANCER EXCISION  back   US ECHOCARDIOGRAPHY  07/09/2010    Social History   Tobacco Use  Smoking Status Never  Smokeless Tobacco Never    Social History   Substance and Sexual Activity  Alcohol Use No   Alcohol/week: 0.0 standard drinks of alcohol    Family History  Problem Relation Age of Onset   Heart attack Father    Hypertension Father    Stroke Father    Hypertension Brother    Stroke Brother    Coronary artery disease Brother    Stroke Sister    Colon cancer Neg Hx     Review of Systems: As noted in history of present illness.  All other systems were reviewed and are negative.  Physical Exam: There were no vitals taken for this visit. GENERAL:  Well appearing WM in NAD HEENT:  PERRL, EOMI, sclera are clear. Oropharynx is clear. NECK:  No jugular venous distention, carotid upstroke brisk and symmetric, no bruits, no thyromegaly or adenopathy LUNGS:  Clear to auscultation bilaterally CHEST:  Unremarkable HEART:  RRR,  PMI not displaced or sustained,S1 and S2 within normal limits, no S3, no S4: no clicks, no rubs, no murmurs ABD:  Soft, nontender. BS +, no masses or bruits. No hepatomegaly, no  splenomegaly EXT:  2 + pulses throughout, no edema, no cyanosis no clubbing SKIN:  Warm and dry.  No rashes NEURO:  Alert and oriented x 3. Cranial nerves II through XII intact. PSYCH:  Cognitively intact    LABORATORY DATA: Lab Results  Component Value Date   WBC 5.7 02/14/2020   HGB 14.7 02/14/2020   HCT 43.6 02/14/2020   PLT 192 02/14/2020   GLUCOSE 96 02/14/2020   CHOL 175 04/21/2022   TRIG 155 (H) 04/21/2022   HDL 41 04/21/2022   LDLDIRECT 121.8 06/01/2011   LDLCALC 107 (H) 04/21/2022   ALT 43 04/21/2022   AST 28 02/14/2020   NA 138 02/14/2020   K 4.3 02/14/2020   CL 102 02/14/2020   CREATININE 0.65 (L) 02/14/2020   BUN 8 02/14/2020   CO2 23 02/14/2020   TSH 1.790 01/03/2018   INR 1.1 (H) 10/20/2011   Ecg is  done today- NSR normal Ecg.rate 70. Normal. I have personally reviewed and interpreted this study.   Holter monitor: 07/26/16: Study Highlights     Normal sinus rhythm Rare PACs with one PAC couplet and one PAC triplet Rare PVCs     Coronary calcium score 05/21/22:  OVER-READ INTERPRETATION  CT CHEST   The following report is an over-read performed by radiologist Dr. Royal Piedra [facility] on 05/21/2022. This over-read does not include interpretation of cardiac or coronary anatomy or pathology. The coronary calcium score interpretation by the cardiologist is attached.   COMPARISON:  None.   FINDINGS: Tiny pulmonary nodules in the right middle lobe measuring up to 4 mm (axial image 13 of series 5). Within the visualized portions of the thorax there are no other larger more suspicious appearing pulmonary nodules or masses, there is no acute consolidative airspace disease, no pleural effusions, no pneumothorax and no lymphadenopathy. Visualized portions of the upper abdomen are unremarkable. There are no aggressive appearing lytic or blastic lesions noted in the visualized portions of the skeleton.   IMPRESSION: 1. Small  pulmonary nodules in the right middle lobe measuring 4 mm or less in size, nonspecific, but statistically likely benign. No follow-up needed if patient is low-risk (and has no known or suspected primary neoplasm). Non-contrast chest  CT can be considered in 12 months if patient is high-risk. This recommendation follows the consensus statement: Guidelines for Management of Incidental Pulmonary Nodules Detected on CT Images: From the Fleischner Society 2017; Radiology 2017; 284:228-243.     Electronically Signed   By: Trudie Reed M.D.   On: 05/21/2022 13:10    Addended by Florencia Reasons, MD on 05/21/2022  1:12 PM    Study Result  Narrative & Impression  CLINICAL DATA:  26M for cardiovascular disease risk stratification   EXAM: Coronary Calcium Score   TECHNIQUE: A gated, non-contrast computed tomography scan of the heart was performed using 3mm slice thickness. Axial images were analyzed on a dedicated workstation. Calcium scoring of the coronary arteries was performed using the Agatston method.   FINDINGS: Coronary arteries: Normal origins.   Coronary Calcium Score:   Left main: 2.13   Left anterior descending artery: 3.78   Left circumflex artery: 0   Right coronary artery: 0   Total: 5.91   Percentile: 27th   Pericardium: Normal.   Ascending Aorta: Mildly dilated.  4.1 cm.   Non-cardiac: See separate report from [facility].   IMPRESSION: Coronary calcium score of 5.91. This was 27th percentile for age-, race-, and sex-matched controls.   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       Assessment / Plan: 1. Hypertension. Blood pressures under excellent control. Continue amlodipine 5 mg daily and losartan.   2. Hyperlipidemia. Follow up lab work today. Given family history may want to consider a statin.   3. Palpitations. Prior history of PSVT. Well controlled.   Follow up in one year.

## 2023-05-22 ENCOUNTER — Ambulatory Visit: Payer: Self-pay | Admitting: Cardiology

## 2023-05-29 NOTE — Progress Notes (Signed)
 Cordella Pattee Date of Birth: November 09, 1960   History of Present Illness: [name] is seen for followup PSVT and HTN. He  has a history of nonsustained PSVT.  He is on losartan  and amlodipine  for BP control.   He had a coronary calcium score in Jan 2024 which was 5.91.   On follow up today he is doing very well. He is back and forth working at [employer]. Denies any chest pain, palpitations, dizziness. Doesn't sleep well and states he is prone to nod off at times.   Current Outpatient Medications on File Prior to Visit  Medication Sig Dispense Refill   Acai 500 MG CAPS Take 1,000 mg by mouth.      ALFALFA PO Take by mouth as directed.     amLODipine  (NORVASC ) 5 MG tablet Take 1 tablet (5 mg total) by mouth daily. 90 tablet 0   Arginine 500 MG CAPS Take by mouth. 400 mg     BIOTIN PO Take by mouth.     cholecalciferol (VITAMIN D) 1000 UNITS tablet Take 1,000 Units by mouth daily.     Cinnamon 500 MG TABS Take by mouth.       Cod Liver Oil 1250-130 UNITS CAPS Take by mouth.       Coenzyme Q10 (CO Q-10) 200 MG CAPS Take by mouth.       Cranberry 250 MG CAPS Take by mouth.       Cyanocobalamin (B-12) 500 MCG TABS Take 1,000 mcg by mouth.      EPINEPHrine  0.3 mg/0.3 mL IJ SOAJ injection Inject 0.3 mLs (0.3 mg total) into the muscle once. 2 Device 1   Flaxseed, Linseed, (FLAX SEED OIL PO) Take 2,400 mg by mouth.       folic acid (FOLVITE) 800 MCG tablet Take 400 mcg by mouth daily.      Garlic 2000 MG CAPS Take 75 mg by mouth.      Glucosamine-Chondroitin-Vit C 2000-1200-60 MG/30ML LIQD Take by mouth.       Hawthorne Berry 550 MG CAPS Take by mouth.       losartan  (COZAAR ) 100 MG tablet Take 1 tablet (100 mg total) by mouth daily. 90 tablet 0   Magnesium 250 MG TABS Take 500 mg by mouth.      Niacin-Inositol 400-100 MG CAPS Take by mouth.       Omega-3 Fatty Acids (FISH OIL) 1000 MG CPDR Take by mouth.     Phytosterol Esters (PHYTOSTEROL COMPLEX) 500 MG CAPS Take by mouth.        pyridOXINE (VITAMIN B-6) 100 MG tablet Take 100 mg by mouth daily.       Saw Palmetto, Serenoa repens, 450 MG CAPS Take 900 mg by mouth 2 (two) times daily.      vitamin C (ASCORBIC ACID) 500 MG tablet Take 500 mg by mouth daily.       No current facility-administered medications on file prior to visit.    Allergies  Allergen Reactions   Bee Venom     Past Medical History:  Diagnosis Date   Hemorrhoids    HTN (hypertension)    Hypertriglyceridemia    Prostatitis     Past Surgical History:  Procedure Laterality Date   HEMORRHOID SURGERY  09/21/10   hemorrhoidal banding x2; Rt anterior hemorrhoidectomy   SKIN CANCER EXCISION     back   US  ECHOCARDIOGRAPHY  07/09/2010    Social History   Tobacco Use  Smoking Status Never  Smokeless Tobacco Never    Social History   Substance and Sexual Activity  Alcohol Use No   Alcohol/week: 0.0 standard drinks of alcohol    Family History  Problem Relation Age of Onset   Heart attack Father    Hypertension Father    Stroke Father    Hypertension Brother    Stroke Brother    Coronary artery disease Brother    Stroke Sister    Colon cancer Neg Hx     Review of Systems: As noted in history of present illness.  All other systems were reviewed and are negative.  Physical Exam: There were no vitals taken for this visit. GENERAL:  Well appearing WM in NAD HEENT:  PERRL, EOMI, sclera are clear. Oropharynx is clear. NECK:  No jugular venous distention, carotid upstroke brisk and symmetric, no bruits, no thyromegaly or adenopathy LUNGS:  Clear to auscultation bilaterally CHEST:  Unremarkable HEART:  RRR,  PMI not displaced or sustained,S1 and S2 within normal limits, no S3, no S4: no clicks, no rubs, no murmurs ABD:  Soft, nontender. BS +, no masses or bruits. No hepatomegaly, no splenomegaly EXT:  2 + pulses throughout, no edema, no cyanosis no clubbing SKIN:  Warm and dry.  No rashes NEURO:  Alert and oriented x 3. Cranial  nerves II through XII intact. PSYCH:  Cognitively intact    LABORATORY DATA: Lab Results  Component Value Date   WBC 5.7 02/14/2020   HGB 14.7 02/14/2020   HCT 43.6 02/14/2020   PLT 192 02/14/2020   GLUCOSE 96 02/14/2020   CHOL 175 04/21/2022   TRIG 155 (H) 04/21/2022   HDL 41 04/21/2022   LDLDIRECT 121.8 06/01/2011   LDLCALC 107 (H) 04/21/2022   ALT 43 04/21/2022   AST 28 02/14/2020   NA 138 02/14/2020   K 4.3 02/14/2020   CL 102 02/14/2020   CREATININE 0.65 (L) 02/14/2020   BUN 8 02/14/2020   CO2 23 02/14/2020   TSH 1.790 01/03/2018   INR 1.1 (H) 10/20/2011   EKG Interpretation Date/Time:  Friday June 02 2023 08:07:24 EST Ventricular Rate:  71 PR Interval:  174 QRS Duration:  98 QT Interval:  390 QTC Calculation: 423 R Axis:   38  Text Interpretation: Normal sinus rhythm Normal ECG  compared to prior study dated 04/10/22 there is no change Confirmed by Jawanda Passey [phone]) on 06/02/2023 8:12:52 AM    Holter monitor: 07/26/16: Study Highlights     Normal sinus rhythm Rare PACs with one PAC couplet and one PAC triplet Rare PVCs     Coronary calcium score 05/21/22:  OVER-READ INTERPRETATION  CT CHEST   The following report is an over-read performed by radiologist Dr. Toribio Cove [facility] on 05/21/2022. This over-read does not include interpretation of cardiac or coronary anatomy or pathology. The coronary calcium score interpretation by the cardiologist is attached.   COMPARISON:  None.   FINDINGS: Tiny pulmonary nodules in the right middle lobe measuring up to 4 mm (axial image 13 of series 5). Within the visualized portions of the thorax there are no other larger more suspicious appearing pulmonary nodules or masses, there is no acute consolidative airspace disease, no pleural effusions, no pneumothorax and no lymphadenopathy. Visualized portions of the upper abdomen are unremarkable. There are no aggressive appearing lytic or  blastic lesions noted in the visualized portions of the skeleton.   IMPRESSION: 1. Small pulmonary nodules in the right middle lobe measuring 4 mm or less  in size, nonspecific, but statistically likely benign. No follow-up needed if patient is low-risk (and has no known or suspected primary neoplasm). Non-contrast chest CT can be considered in 12 months if patient is high-risk. This recommendation follows the consensus statement: Guidelines for Management of Incidental Pulmonary Nodules Detected on CT Images: From the Fleischner Society 2017; Radiology 2017; 284:228-243.     Electronically Signed   By: Toribio Aye M.D.   On: 05/21/2022 13:10    Addended by Aye Toribio ORN, MD on 05/21/2022  1:12 PM    Study Result  Narrative & Impression  CLINICAL DATA:  73M for cardiovascular disease risk stratification   EXAM: Coronary Calcium Score   TECHNIQUE: A gated, non-contrast computed tomography scan of the heart was performed using 3mm slice thickness. Axial images were analyzed on a dedicated workstation. Calcium scoring of the coronary arteries was performed using the Agatston method.   FINDINGS: Coronary arteries: Normal origins.   Coronary Calcium Score:   Left main: 2.13   Left anterior descending artery: 3.78   Left circumflex artery: 0   Right coronary artery: 0   Total: 5.91   Percentile: 27th   Pericardium: Normal.   Ascending Aorta: Mildly dilated.  4.1 cm.   Non-cardiac: See separate report from [facility].   IMPRESSION: Coronary calcium score of 5.91. This was 27th percentile for age-, race-, and sex-matched controls.   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       Assessment / Plan: 1. Hypertension. Blood pressures under excellent control. Continue amlodipine  and losartan .   2. Hyperlipidemia. Follow up lab work today. Low coronary calcium score of 5.9. lifestyle modification.   3. Palpitations. Prior history of PSVT.  Well controlled.   Follow up in one year.

## 2023-06-02 ENCOUNTER — Encounter: Payer: Self-pay | Admitting: Cardiology

## 2023-06-02 ENCOUNTER — Ambulatory Visit: Payer: Medicare Other | Attending: Cardiology | Admitting: Cardiology

## 2023-06-02 VITALS — BP 116/70 | HR 71 | Ht 72.0 in | Wt 201.0 lb

## 2023-06-02 DIAGNOSIS — I10 Essential (primary) hypertension: Secondary | ICD-10-CM | POA: Insufficient documentation

## 2023-06-02 DIAGNOSIS — E785 Hyperlipidemia, unspecified: Secondary | ICD-10-CM | POA: Diagnosis present

## 2023-06-02 LAB — BASIC METABOLIC PANEL
BUN/Creatinine Ratio: 23 (ref 10–24)
BUN: 16 mg/dL (ref 8–27)
CO2: 20 mmol/L (ref 20–29)
Calcium: 9.2 mg/dL (ref 8.6–10.2)
Chloride: 104 mmol/L (ref 96–106)
Creatinine, Ser: 0.7 mg/dL — ABNORMAL LOW (ref 0.76–1.27)
Glucose: 116 mg/dL — ABNORMAL HIGH (ref 70–99)
Potassium: 4.1 mmol/L (ref 3.5–5.2)
Sodium: 138 mmol/L (ref 134–144)
eGFR: 102 mL/min/{1.73_m2} (ref >59)

## 2023-06-02 LAB — HEPATIC FUNCTION PANEL
ALT: 41 [IU]/L (ref 0–44)
AST: 29 [IU]/L (ref 0–40)
Albumin: 4.1 g/dL (ref 3.9–4.9)
Alkaline Phosphatase: 84 [IU]/L (ref 44–121)
Bilirubin Total: 0.4 mg/dL (ref 0.0–1.2)
Bilirubin, Direct: 0.18 mg/dL (ref 0.00–0.40)
Total Protein: 6.5 g/dL (ref 6.0–8.5)

## 2023-06-02 LAB — LIPID PANEL
Chol/HDL Ratio: 5 {ratio} (ref 0.0–5.0)
Cholesterol, Total: 166 mg/dL (ref 100–199)
HDL: 33 mg/dL — ABNORMAL LOW (ref >39)
LDL Chol Calc (NIH): 86 mg/dL (ref 0–99)
Triglycerides: 280 mg/dL — ABNORMAL HIGH (ref 0–149)
VLDL Cholesterol Cal: 47 mg/dL — ABNORMAL HIGH (ref 5–40)

## 2023-06-02 MED ORDER — LOSARTAN POTASSIUM 100 MG PO TABS
100.0000 mg | ORAL_TABLET | Freq: Every day | ORAL | 3 refills | Status: AC
Start: 2023-06-02 — End: ?

## 2023-06-02 MED ORDER — AMLODIPINE BESYLATE 5 MG PO TABS: 5.0000 mg | ORAL_TABLET | Freq: Every day | ORAL | 3 refills | Status: AC

## 2023-06-02 NOTE — Patient Instructions (Signed)
 Medication Instructions:  Continue same medications *If you need a refill on your cardiac medications before your next appointment, please call your pharmacy*   Lab Work: Bmet,lipid and hepatic panels today   Testing/Procedures: None ordered   Follow-Up: At [facility], you and your health needs are our priority.  As part of our continuing mission to provide you with exceptional heart care, we have created designated Provider Care Teams.  These Care Teams include your primary Cardiologist (physician) and Advanced Practice Providers (APPs -  Physician Assistants and Nurse Practitioners) who all work together to provide you with the care you need, when you need it.  We recommend signing up for the patient portal called MyChart.  Sign up information is provided on this After Visit Summary.  MyChart is used to connect with patients for Virtual Visits (Telemedicine).  Patients are able to view lab/test results, encounter notes, upcoming appointments, etc.  Non-urgent messages can be sent to your provider as well.   To learn more about what you can do with MyChart, go to [email or web address].    Your next appointment: 1 Year      Call in Oct to schedule Feb appointment      Provider:  Dr.Jordan

## 2024-05-17 ENCOUNTER — Telehealth: Payer: Self-pay | Admitting: Cardiology

## 2024-05-17 NOTE — Telephone Encounter (Signed)
" °  The patient called and expressed frustration about waiting 20 minutes to get through. He stated that it was poor business practice and felt that patient care was lacking. I attempted to explain, but the patient declined further discussion and continued to express dissatisfaction. He stated that he no longer wished to see Dr. Jordan and requested to cancel the appointment request for his annual follow-up. He indicated that he plans to seek care with a different cardiologist. "
